# Patient Record
Sex: Female | Born: 1940 | Race: White | Hispanic: No | Marital: Married | Smoking: Never smoker
Health system: Southern US, Community
[De-identification: ages and names within clinical notes are randomized; demographics above are authoritative.]

## PROBLEM LIST (undated history)

## (undated) DIAGNOSIS — E785 Hyperlipidemia, unspecified: Secondary | ICD-10-CM

## (undated) DIAGNOSIS — C719 Malignant neoplasm of brain, unspecified: Secondary | ICD-10-CM

## (undated) DIAGNOSIS — N951 Menopausal and female climacteric states: Secondary | ICD-10-CM

## (undated) DIAGNOSIS — M3500 Sicca syndrome, unspecified: Secondary | ICD-10-CM

## (undated) DIAGNOSIS — I10 Essential (primary) hypertension: Secondary | ICD-10-CM

## (undated) DIAGNOSIS — C50919 Malignant neoplasm of unspecified site of unspecified female breast: Secondary | ICD-10-CM

## (undated) DIAGNOSIS — E039 Hypothyroidism, unspecified: Secondary | ICD-10-CM

## (undated) DIAGNOSIS — C8590 Non-Hodgkin lymphoma, unspecified, unspecified site: Secondary | ICD-10-CM

## (undated) HISTORY — DX: Non-Hodgkin lymphoma, unspecified, unspecified site: C85.90

## (undated) HISTORY — DX: Menopausal and female climacteric states: N95.1

## (undated) HISTORY — DX: Hyperlipidemia, unspecified: E78.5

## (undated) HISTORY — DX: Sjogren syndrome, unspecified: M35.00

## (undated) HISTORY — DX: Malignant neoplasm of brain, unspecified: C71.9

## (undated) HISTORY — DX: Malignant neoplasm of unspecified site of unspecified female breast: C50.919

## (undated) HISTORY — DX: Essential (primary) hypertension: I10

## (undated) HISTORY — DX: Hypothyroidism, unspecified: E03.9

## (undated) HISTORY — PX: BREAST SURGERY: SHX581

## (undated) HISTORY — PX: TUBAL LIGATION: SHX77

---

## 1973-07-04 HISTORY — PX: TONSILLECTOMY: SHX5217

## 1997-12-29 ENCOUNTER — Ambulatory Visit (HOSPITAL_COMMUNITY): Admission: RE | Admit: 1997-12-29 | Discharge: 1997-12-29 | Payer: Self-pay | Admitting: *Deleted

## 1998-01-20 ENCOUNTER — Other Ambulatory Visit: Admission: RE | Admit: 1998-01-20 | Discharge: 1998-01-20 | Payer: Self-pay | Admitting: Family Medicine

## 1998-12-31 ENCOUNTER — Encounter: Payer: Self-pay | Admitting: *Deleted

## 1998-12-31 ENCOUNTER — Ambulatory Visit (HOSPITAL_COMMUNITY): Admission: RE | Admit: 1998-12-31 | Discharge: 1998-12-31 | Payer: Self-pay | Admitting: *Deleted

## 2000-01-03 ENCOUNTER — Encounter: Payer: Self-pay | Admitting: *Deleted

## 2000-01-03 ENCOUNTER — Ambulatory Visit (HOSPITAL_COMMUNITY): Admission: RE | Admit: 2000-01-03 | Discharge: 2000-01-03 | Payer: Self-pay | Admitting: *Deleted

## 2000-02-23 ENCOUNTER — Other Ambulatory Visit: Admission: RE | Admit: 2000-02-23 | Discharge: 2000-02-23 | Payer: Self-pay | Admitting: Family Medicine

## 2001-01-03 ENCOUNTER — Ambulatory Visit (HOSPITAL_COMMUNITY): Admission: RE | Admit: 2001-01-03 | Discharge: 2001-01-03 | Payer: Self-pay | Admitting: *Deleted

## 2001-01-03 ENCOUNTER — Encounter: Payer: Self-pay | Admitting: *Deleted

## 2002-01-18 ENCOUNTER — Encounter: Payer: Self-pay | Admitting: *Deleted

## 2002-01-18 ENCOUNTER — Ambulatory Visit (HOSPITAL_COMMUNITY): Admission: RE | Admit: 2002-01-18 | Discharge: 2002-01-18 | Payer: Self-pay | Admitting: *Deleted

## 2002-08-16 ENCOUNTER — Other Ambulatory Visit: Admission: RE | Admit: 2002-08-16 | Discharge: 2002-08-16 | Payer: Self-pay | Admitting: Family Medicine

## 2003-01-21 ENCOUNTER — Encounter: Payer: Self-pay | Admitting: *Deleted

## 2003-01-21 ENCOUNTER — Ambulatory Visit (HOSPITAL_COMMUNITY): Admission: RE | Admit: 2003-01-21 | Discharge: 2003-01-21 | Payer: Self-pay | Admitting: *Deleted

## 2003-08-27 ENCOUNTER — Other Ambulatory Visit: Admission: RE | Admit: 2003-08-27 | Discharge: 2003-08-27 | Payer: Self-pay | Admitting: Family Medicine

## 2004-02-13 ENCOUNTER — Ambulatory Visit (HOSPITAL_COMMUNITY): Admission: RE | Admit: 2004-02-13 | Discharge: 2004-02-13 | Payer: Self-pay | Admitting: Family Medicine

## 2004-06-24 ENCOUNTER — Ambulatory Visit (HOSPITAL_COMMUNITY): Admission: RE | Admit: 2004-06-24 | Discharge: 2004-06-24 | Payer: Self-pay | Admitting: Family Medicine

## 2004-07-04 HISTORY — PX: CHOLECYSTECTOMY, LAPAROSCOPIC: SHX56

## 2004-08-26 ENCOUNTER — Ambulatory Visit (HOSPITAL_COMMUNITY): Admission: RE | Admit: 2004-08-26 | Discharge: 2004-08-26 | Payer: Self-pay | Admitting: General Surgery

## 2004-08-26 ENCOUNTER — Encounter (INDEPENDENT_AMBULATORY_CARE_PROVIDER_SITE_OTHER): Payer: Self-pay | Admitting: Specialist

## 2004-09-29 ENCOUNTER — Other Ambulatory Visit: Admission: RE | Admit: 2004-09-29 | Discharge: 2004-09-29 | Payer: Self-pay | Admitting: Family Medicine

## 2005-02-17 ENCOUNTER — Ambulatory Visit (HOSPITAL_COMMUNITY): Admission: RE | Admit: 2005-02-17 | Discharge: 2005-02-17 | Payer: Self-pay | Admitting: Family Medicine

## 2006-02-23 ENCOUNTER — Ambulatory Visit (HOSPITAL_COMMUNITY): Admission: RE | Admit: 2006-02-23 | Discharge: 2006-02-23 | Payer: Self-pay | Admitting: Family Medicine

## 2006-04-18 ENCOUNTER — Encounter: Admission: RE | Admit: 2006-04-18 | Discharge: 2006-04-18 | Payer: Self-pay | Admitting: Surgery

## 2006-04-18 ENCOUNTER — Other Ambulatory Visit: Admission: RE | Admit: 2006-04-18 | Discharge: 2006-04-18 | Payer: Self-pay | Admitting: Diagnostic Radiology

## 2006-04-18 ENCOUNTER — Encounter (INDEPENDENT_AMBULATORY_CARE_PROVIDER_SITE_OTHER): Payer: Self-pay | Admitting: *Deleted

## 2006-11-22 ENCOUNTER — Other Ambulatory Visit: Admission: RE | Admit: 2006-11-22 | Discharge: 2006-11-22 | Payer: Self-pay | Admitting: Family Medicine

## 2007-03-01 ENCOUNTER — Ambulatory Visit (HOSPITAL_COMMUNITY): Admission: RE | Admit: 2007-03-01 | Discharge: 2007-03-01 | Payer: Self-pay | Admitting: Family Medicine

## 2007-11-16 ENCOUNTER — Encounter: Payer: Self-pay | Admitting: Infectious Diseases

## 2007-12-10 ENCOUNTER — Encounter: Payer: Self-pay | Admitting: Infectious Diseases

## 2007-12-11 ENCOUNTER — Ambulatory Visit (HOSPITAL_COMMUNITY): Admission: RE | Admit: 2007-12-11 | Discharge: 2007-12-11 | Payer: Self-pay | Admitting: Infectious Diseases

## 2007-12-11 ENCOUNTER — Ambulatory Visit: Payer: Self-pay | Admitting: Infectious Diseases

## 2007-12-11 DIAGNOSIS — R509 Fever, unspecified: Secondary | ICD-10-CM

## 2007-12-11 LAB — CONVERTED CEMR LAB
BUN: 11 mg/dL (ref 6–23)
CO2: 25 meq/L (ref 19–32)
Creatinine, Ser: 0.85 mg/dL (ref 0.40–1.20)
Eosinophils Absolute: 0.1 10*3/uL (ref 0.0–0.7)
Eosinophils Relative: 3 % (ref 0–5)
Glucose, Bld: 102 mg/dL — ABNORMAL HIGH (ref 70–99)
HCT: 47.6 % — ABNORMAL HIGH (ref 36.0–46.0)
Hepatitis B Surface Ag: NEGATIVE
Lymphs Abs: 0.9 10*3/uL (ref 0.7–4.0)
MCV: 87.2 fL (ref 78.0–100.0)
Monocytes Relative: 26 % — ABNORMAL HIGH (ref 3–12)
Platelets: 271 10*3/uL (ref 150–400)
Sed Rate: 12 mm/hr (ref 0–22)
TSH: 1.283 microintl units/mL (ref 0.350–5.50)
Total Bilirubin: 0.6 mg/dL (ref 0.3–1.2)
WBC: 3.2 10*3/uL — ABNORMAL LOW (ref 4.0–10.5)

## 2007-12-13 ENCOUNTER — Encounter: Payer: Self-pay | Admitting: Infectious Diseases

## 2007-12-13 ENCOUNTER — Ambulatory Visit (HOSPITAL_COMMUNITY): Admission: RE | Admit: 2007-12-13 | Discharge: 2007-12-13 | Payer: Self-pay | Admitting: Infectious Diseases

## 2007-12-18 ENCOUNTER — Ambulatory Visit: Payer: Self-pay | Admitting: Infectious Diseases

## 2007-12-20 ENCOUNTER — Ambulatory Visit (HOSPITAL_COMMUNITY): Admission: RE | Admit: 2007-12-20 | Discharge: 2007-12-20 | Payer: Self-pay | Admitting: Infectious Diseases

## 2007-12-24 ENCOUNTER — Telehealth: Payer: Self-pay | Admitting: Infectious Diseases

## 2008-01-02 ENCOUNTER — Encounter: Payer: Self-pay | Admitting: Infectious Diseases

## 2008-01-02 ENCOUNTER — Ambulatory Visit (HOSPITAL_COMMUNITY): Admission: RE | Admit: 2008-01-02 | Discharge: 2008-01-02 | Payer: Self-pay | Admitting: Infectious Diseases

## 2008-01-10 ENCOUNTER — Telehealth (INDEPENDENT_AMBULATORY_CARE_PROVIDER_SITE_OTHER): Payer: Self-pay | Admitting: Licensed Clinical Social Worker

## 2008-01-30 ENCOUNTER — Encounter: Admission: RE | Admit: 2008-01-30 | Discharge: 2008-01-30 | Payer: Self-pay | Admitting: Infectious Diseases

## 2008-01-30 ENCOUNTER — Ambulatory Visit (HOSPITAL_COMMUNITY): Admission: RE | Admit: 2008-01-30 | Discharge: 2008-01-30 | Payer: Self-pay | Admitting: Infectious Diseases

## 2008-02-12 ENCOUNTER — Ambulatory Visit: Payer: Self-pay | Admitting: Infectious Diseases

## 2008-02-12 DIAGNOSIS — M310 Hypersensitivity angiitis: Secondary | ICD-10-CM | POA: Insufficient documentation

## 2008-02-13 ENCOUNTER — Telehealth: Payer: Self-pay | Admitting: Infectious Diseases

## 2008-02-27 ENCOUNTER — Encounter (INDEPENDENT_AMBULATORY_CARE_PROVIDER_SITE_OTHER): Payer: Self-pay | Admitting: Interventional Radiology

## 2008-02-27 ENCOUNTER — Ambulatory Visit (HOSPITAL_COMMUNITY): Admission: RE | Admit: 2008-02-27 | Discharge: 2008-02-27 | Payer: Self-pay | Admitting: Infectious Diseases

## 2008-03-03 ENCOUNTER — Telehealth: Payer: Self-pay | Admitting: Infectious Diseases

## 2008-03-11 ENCOUNTER — Encounter: Payer: Self-pay | Admitting: Infectious Diseases

## 2008-03-11 ENCOUNTER — Telehealth: Payer: Self-pay | Admitting: Infectious Diseases

## 2008-06-11 LAB — HM DEXA SCAN

## 2009-02-13 ENCOUNTER — Encounter: Admission: RE | Admit: 2009-02-13 | Discharge: 2009-02-13 | Payer: Self-pay | Admitting: Obstetrics and Gynecology

## 2010-03-05 ENCOUNTER — Encounter: Admission: RE | Admit: 2010-03-05 | Discharge: 2010-03-05 | Payer: Self-pay | Admitting: Obstetrics and Gynecology

## 2010-03-10 LAB — HM MAMMOGRAPHY

## 2010-08-01 LAB — CONVERTED CEMR LAB
Albumin ELP: 60.3 % (ref 55.8–66.1)
Albumin, U: DETECTED %
Alpha 1, Urine: DETECTED % — AB
Alpha 2, Urine: DETECTED % — AB
Basophils Absolute: 0 10*3/uL (ref 0.0–0.1)
Beta Globulin: 7.6 % — ABNORMAL HIGH (ref 4.7–7.2)
CO2: 28 meq/L (ref 19–32)
Eosinophils Absolute: 0 10*3/uL (ref 0.0–0.7)
Free Kappa Lt Chains,Ur: 1 mg/dL (ref 0.04–1.51)
Glucose, Bld: 98 mg/dL (ref 70–99)
IgG (Immunoglobin G), Serum: 852 mg/dL (ref 694–1618)
IgM, Serum: 122 mg/dL (ref 60–263)
Lymphocytes Relative: 29 % (ref 12–46)
Lymphs Abs: 0.8 10*3/uL (ref 0.7–4.0)
Neutrophils Relative %: 45 % (ref 43–77)
Platelets: 198 10*3/uL (ref 150–400)
Potassium: 4.5 meq/L (ref 3.5–5.3)
Sodium: 142 meq/L (ref 135–145)
Total Protein, Serum Electrophoresis: 6.6 g/dL (ref 6.0–8.3)
WBC: 2.9 10*3/uL — ABNORMAL LOW (ref 4.0–10.5)

## 2010-10-05 ENCOUNTER — Encounter: Payer: Self-pay | Admitting: Family Medicine

## 2010-10-05 DIAGNOSIS — E785 Hyperlipidemia, unspecified: Secondary | ICD-10-CM

## 2010-10-05 DIAGNOSIS — Z780 Asymptomatic menopausal state: Secondary | ICD-10-CM

## 2010-10-05 DIAGNOSIS — C50919 Malignant neoplasm of unspecified site of unspecified female breast: Secondary | ICD-10-CM

## 2010-10-05 DIAGNOSIS — E039 Hypothyroidism, unspecified: Secondary | ICD-10-CM

## 2010-10-05 DIAGNOSIS — M3500 Sicca syndrome, unspecified: Secondary | ICD-10-CM

## 2010-10-06 ENCOUNTER — Encounter: Payer: Self-pay | Admitting: Family Medicine

## 2010-10-06 DIAGNOSIS — M810 Age-related osteoporosis without current pathological fracture: Secondary | ICD-10-CM | POA: Insufficient documentation

## 2010-10-06 DIAGNOSIS — C50919 Malignant neoplasm of unspecified site of unspecified female breast: Secondary | ICD-10-CM | POA: Insufficient documentation

## 2010-10-06 DIAGNOSIS — M3500 Sicca syndrome, unspecified: Secondary | ICD-10-CM | POA: Insufficient documentation

## 2010-10-06 DIAGNOSIS — E039 Hypothyroidism, unspecified: Secondary | ICD-10-CM | POA: Insufficient documentation

## 2010-10-06 DIAGNOSIS — E785 Hyperlipidemia, unspecified: Secondary | ICD-10-CM | POA: Insufficient documentation

## 2010-10-06 DIAGNOSIS — Z780 Asymptomatic menopausal state: Secondary | ICD-10-CM | POA: Insufficient documentation

## 2010-11-19 NOTE — Op Note (Signed)
NAMEJALEN, Wendy Kelley                ACCOUNT NO.:  [id number]   MEDICAL RECORD NO.:  [id number]          PATIENT TYPE:  AMB   LOCATION:  DAY                          FACILITY:  [facility]   PHYSICIAN:  Leonie Man, M.D.   DATE OF BIRTH:  1940/09/22   DATE OF PROCEDURE:  08/26/2004  DATE OF DISCHARGE:                                 OPERATIVE REPORT   PREOPERATIVE DIAGNOSIS:  Chronic calculous cholecystitis.   POSTOPERATIVE DIAGNOSES:  Chronic calculous cholecystitis.   PROCEDURES:  Laparoscopic cholecystectomy with intraoperative cholangiogram.   SURGEON:  Leonie Man, M.D.   ASSISTANT:  Rose Phi. Maple Hudson, M.D.   ANESTHESIA:  General.   Ms. Dault is a 70 year old lady with a diagnosis of  __________ syndrome  who presents with epigastric pain. This problem complicated by the fact that  she does have some reflux.. Every morning, she has a spell of vomiting. She  also has some diarrhea in the morning.  She had a CT scan of the abdomen  which showed cholelithiasis. The patient comes to the operating room now  after the risks and potential benefits of surgery have been discussed and  all questions answered, consent obtained. Liver function studies are all  within normal limits.   PROCEDURE:  Following the induction of satisfactory general anesthesia, the  patient is positioned supinely and the abdomen is routinely prepped and  draped to be included in the sterile operative field. Open laparoscopy was  created at the umbilicus with insertion of a Hassan type cannula,  insufflation of the peritoneal cavity carried out with carbon dioxide to 14  mmHg pressure. The camera was inserted and visual exploration of the abdomen  carried out. The gallbladder was chronically scarred with some adhesions to  the wall of the gallbladder. The liver edges were sharp. Liver surfaces  smooth. None of the small or large intestine viewed appeared to be abnormal.  Under direct vision, epigastric and  lateral ports are placed. The  gallbladder was grasped and retracted cephalad. The dissection of adhesions  to the gallbladder wall are taken down and the dissection carried down to  the ampulla. The cystic duct and cystic artery were then identified tracing  the cystic duct up to its gallbladder junction and the cystic artery to its  entry into the gallbladder wall. The cystic duct was clipped proximally and  opened, the cystic artery was doubly clipped and transected. The cystic duct  catheter was passed into the abdomen then inserted into the cystic duct. I  injected 1/2 strength Hypaque into the cystic duct under fluoroscopic  control. The resulting cholangiogram showed prompt flow of contrast through  a normal caliber common bile duct and common hepatic duct with prompt entry  into the duodenum. The upper hepatic radicles appeared to be entirely  normal. The Cystic duct catheter was removed and the cystic duct was triply  clipped and transected. The gallbladder was then dissected free from the  liver bed using electrocautery and maintaining hemostasis throughout the  course of the dissection. At the end of dissection, the right upper quadrant  was then  thoroughly irrigated with normal saline and aspirated. The  gallbladder was retrieved through the umbilical port without difficulty. The  pneumoperitoneum was allowed to deflate after the lateral trocars were  removed under direct vision. All trocars were then removed. The wounds were  closed in layers  as follows:  The umbilical wound in two layers with #0Vicryl and 4-0  Monocryl, epigastric wound closed with 4-0 Monocryl, lateral flank wounds,  epigastric and umbilical wounds then reinforced with Dermabond.  The patient  was then removed from the operating room to the recovery room in stable  condition.  She tolerated the procedure well.      PB/MEDQ  D:  08/26/2004  T:  08/26/2004  Job:  [id number]   cc:   Charlesetta Shanks  [address]  [place]  [place] [address]  Fax: [phone]

## 2011-01-25 ENCOUNTER — Ambulatory Visit (INDEPENDENT_AMBULATORY_CARE_PROVIDER_SITE_OTHER): Payer: Medicare Other | Admitting: Cardiology

## 2011-01-25 ENCOUNTER — Encounter: Payer: Self-pay | Admitting: Cardiology

## 2011-01-25 VITALS — BP 142/76 | HR 112 | Ht 63.0 in | Wt 131.0 lb

## 2011-01-25 DIAGNOSIS — R000 Tachycardia, unspecified: Secondary | ICD-10-CM

## 2011-01-25 DIAGNOSIS — R9431 Abnormal electrocardiogram [ECG] [EKG]: Secondary | ICD-10-CM

## 2011-01-25 NOTE — Assessment & Plan Note (Signed)
Electrocardiogram shows cannot rule out prior septal infarct versus lead placement. Will schedule echocardiogram to assess LV function and wall motion. Will also exclude pericardial effusion given mild tachycardia. If negative no plans for further cardiac evaluation.

## 2011-01-25 NOTE — Progress Notes (Signed)
HPI: 70 year old female with no prior cardiac history for evaluation of abnormal electrocardiogram. Echocardiogram in June of 2009 showed an ejection fraction of 65-70%. Labs 01/07/11 show normal TSH, normal K. Patient with recent URI but otherwise denies dyspnea on exertion, orthopnea and, PND, pedal edema, syncope or chest pain. She states she has had an elevated heart rate for years. She does not have significant palpitations.  Current Outpatient Prescriptions  Medication Sig Dispense Refill  . calcium-vitamin D (OSCAL WITH D) 500-200 MG-UNIT per tablet Take 1 tablet by mouth daily.        . Cholecalciferol (VITAMIN D3) 1000 UNITS CAPS Take 1 capsule by mouth daily.        . fish oil-omega-3 fatty acids 1000 MG capsule Take 2 g by mouth daily.        Marland Kitchen levothyroxine (SYNTHROID, LEVOTHROID) 50 MCG tablet Take 50 mcg by mouth daily.        Marland Kitchen lisinopril (PRINIVIL,ZESTRIL) 10 MG tablet Take 10 mg by mouth daily.        . predniSONE (DELTASONE) 10 MG tablet Take one pill bid prn      . simvastatin (ZOCOR) 20 MG tablet Take 20 mg by mouth at bedtime.         No Known Allergies  Past Medical History  Diagnosis Date  . Sjogrens syndrome   . Breast cancer     H/O chemotherapy  . Symptomatic menopausal or female climacteric states   . Osteoporosis   . Hypothyroidism   . Hyperlipidemia   . Hypertension     Past Surgical History  Procedure Date  . Breast surgery   . Tonsillectomy 1975  . Cholecystectomy, laparoscopic 2006  . Tubal ligation     History   Social History  . Marital Status: Married    Spouse Name: N/A    Number of Children: 4  . Years of Education: N/A   Occupational History  . Not on file.   Social History Main Topics  . Smoking status: Never Smoker   . Smokeless tobacco: Not on file  . Alcohol Use: No  . Drug Use: Not on file  . Sexually Active: Not on file   Other Topics Concern  . Not on file   Social History Narrative  . No narrative on file     Family History  Problem Relation Age of Onset  . Hypertension Mother   . Diabetes type II Mother   . Heart attack Mother     Died at age 62 of MI  . Heart disease Mother   . Diabetes Mother   . Hypothyroidism Sister     ROS: Pain Left Knee and dry mouth but no fevers or chills, productive cough, hemoptysis, dysphasia, odynophagia, melena, hematochezia, dysuria, hematuria, rash, seizure activity, orthopnea, PND, pedal edema, claudication. Remaining systems are negative.  Physical Exam: General:  Well developed/well nourished in NAD Skin warm/dry; ecchymosis over lower extremities Patient not depressed No peripheral clubbing Back-normal HEENT-normal/normal eyelids Neck supple/normal carotid upstroke bilaterally; no bruits; no JVD; no thyromegaly chest - CTA/ normal expansion; S/P right mastectomy CV - tachycardic but regular rhythm/normal S1 and S2; no murmurs, rubs or gallops;  PMI nondisplaced Abdomen -NT/ND, no HSM, no mass, + bowel sounds, no bruit 2+ femoral pulses, no bruits Ext-no edema, chords, 2+ DP Neuro-grossly nonfocal  ECG 01/07/11 - sinus tachycardia at a rate of 105. Axis normal. Cannot rule out prior septal infarct Sinus rhythm at a rate of 112. Cannot rule out  prior septal infarct.

## 2011-01-25 NOTE — Patient Instructions (Signed)
Your physician has requested that you have an echocardiogram. Echocardiography is a painless test that uses sound waves to create images of your heart. It provides your doctor with information about the size and shape of your heart and how well your heart's chambers and valves are working. This procedure takes approximately one hour. There are no restrictions for this procedure.  Your physician recommends that you return for lab work in: today  .  

## 2011-01-25 NOTE — Assessment & Plan Note (Signed)
Patient states her heart rate has been elevated for years. Mildly elevated in the office today. Recent TSH normal. Check hemoglobin. Echocardiogram to quantify LV function.

## 2011-01-26 ENCOUNTER — Other Ambulatory Visit: Payer: Medicare Other

## 2011-01-26 DIAGNOSIS — R000 Tachycardia, unspecified: Secondary | ICD-10-CM

## 2011-01-27 LAB — CBC WITH DIFFERENTIAL/PLATELET
Lymphocytes Relative: 19 % (ref 12–46)
Lymphs Abs: 0.7 10*3/uL (ref 0.7–4.0)
MCV: 93 fL (ref 78.0–100.0)
Neutro Abs: 2.2 10*3/uL (ref 1.7–7.7)
Neutrophils Relative %: 66 % (ref 43–77)
Platelets: 180 10*3/uL (ref 150–400)
RBC: 4.87 MIL/uL (ref 3.87–5.11)
WBC: 3.4 10*3/uL — ABNORMAL LOW (ref 4.0–10.5)

## 2011-01-28 ENCOUNTER — Ambulatory Visit (HOSPITAL_COMMUNITY): Payer: Medicare Other | Attending: Infectious Diseases | Admitting: Radiology

## 2011-01-28 DIAGNOSIS — C50919 Malignant neoplasm of unspecified site of unspecified female breast: Secondary | ICD-10-CM | POA: Insufficient documentation

## 2011-01-28 DIAGNOSIS — I10 Essential (primary) hypertension: Secondary | ICD-10-CM | POA: Insufficient documentation

## 2011-01-28 DIAGNOSIS — E785 Hyperlipidemia, unspecified: Secondary | ICD-10-CM | POA: Insufficient documentation

## 2011-01-28 DIAGNOSIS — R000 Tachycardia, unspecified: Secondary | ICD-10-CM

## 2011-01-28 DIAGNOSIS — I079 Rheumatic tricuspid valve disease, unspecified: Secondary | ICD-10-CM | POA: Insufficient documentation

## 2011-01-28 DIAGNOSIS — R9431 Abnormal electrocardiogram [ECG] [EKG]: Secondary | ICD-10-CM

## 2011-02-03 ENCOUNTER — Encounter: Payer: Self-pay | Admitting: Cardiology

## 2011-03-09 ENCOUNTER — Other Ambulatory Visit: Payer: Self-pay | Admitting: Family Medicine

## 2011-03-09 DIAGNOSIS — Z1231 Encounter for screening mammogram for malignant neoplasm of breast: Secondary | ICD-10-CM

## 2011-03-22 ENCOUNTER — Ambulatory Visit (HOSPITAL_COMMUNITY): Payer: Medicare Other

## 2011-05-05 ENCOUNTER — Ambulatory Visit (HOSPITAL_COMMUNITY)
Admission: RE | Admit: 2011-05-05 | Discharge: 2011-05-05 | Disposition: A | Discharge status: Home / Self Care | Payer: Medicare Other | Source: Ambulatory Visit | Attending: Family Medicine | Admitting: Family Medicine

## 2011-05-05 DIAGNOSIS — Z1231 Encounter for screening mammogram for malignant neoplasm of breast: Secondary | ICD-10-CM

## 2011-10-21 ENCOUNTER — Ambulatory Visit
Admission: RE | Admit: 2011-10-21 | Discharge: 2011-10-21 | Disposition: A | Discharge status: Home / Self Care | Payer: Medicare Other | Source: Ambulatory Visit | Attending: Family Medicine | Admitting: Family Medicine

## 2011-10-21 ENCOUNTER — Other Ambulatory Visit: Payer: Self-pay | Admitting: Family Medicine

## 2011-10-21 DIAGNOSIS — R410 Disorientation, unspecified: Secondary | ICD-10-CM

## 2013-06-24 ENCOUNTER — Encounter (INDEPENDENT_AMBULATORY_CARE_PROVIDER_SITE_OTHER): Payer: Self-pay

## 2013-06-24 ENCOUNTER — Ambulatory Visit (INDEPENDENT_AMBULATORY_CARE_PROVIDER_SITE_OTHER): Payer: Medicare Other | Admitting: Family Medicine

## 2013-06-24 ENCOUNTER — Encounter: Payer: Self-pay | Admitting: Family Medicine

## 2013-06-24 VITALS — BP 104/72 | HR 118 | Temp 99.6°F | Ht 63.0 in | Wt 116.0 lb

## 2013-06-24 DIAGNOSIS — C50919 Malignant neoplasm of unspecified site of unspecified female breast: Secondary | ICD-10-CM

## 2013-06-24 DIAGNOSIS — E785 Hyperlipidemia, unspecified: Secondary | ICD-10-CM

## 2013-06-24 DIAGNOSIS — E039 Hypothyroidism, unspecified: Secondary | ICD-10-CM

## 2013-06-24 DIAGNOSIS — R32 Unspecified urinary incontinence: Secondary | ICD-10-CM | POA: Insufficient documentation

## 2013-06-24 DIAGNOSIS — C8590 Non-Hodgkin lymphoma, unspecified, unspecified site: Secondary | ICD-10-CM | POA: Insufficient documentation

## 2013-06-24 DIAGNOSIS — M3500 Sicca syndrome, unspecified: Secondary | ICD-10-CM

## 2013-06-24 DIAGNOSIS — R42 Dizziness and giddiness: Secondary | ICD-10-CM | POA: Insufficient documentation

## 2013-06-24 DIAGNOSIS — C8589 Other specified types of non-Hodgkin lymphoma, extranodal and solid organ sites: Secondary | ICD-10-CM

## 2013-06-24 LAB — POCT URINALYSIS DIPSTICK
Bilirubin, UA: NEGATIVE
Glucose, UA: NEGATIVE
Ketones, UA: NEGATIVE
Nitrite, UA: POSITIVE
Spec Grav, UA: >=1.03
Urobilinogen, UA: 0.2
pH, UA: 6.5

## 2013-06-24 LAB — POCT UA - MICROSCOPIC ONLY
Casts, Ur, LPF, POC: NEGATIVE
Crystals, Ur, HPF, POC: NEGATIVE
Mucus, UA: NEGATIVE
Yeast, UA: NEGATIVE

## 2013-06-24 NOTE — Progress Notes (Signed)
Patient ID: Wendy Kelley, female   DOB: 1941-05-28, 72 y.o.   MRN: [id number] SUBJECTIVE: CC: Chief Complaint  Patient presents with  . Fall    2 falls yesterday. No LOC and no head injury.   . Urinary Incontinence    HPI:  Came because of frequency of urination, and weakness, and falling for the last 2 days. Patient known to have lymphoma at [facility] for care.  Past Medical History  Diagnosis Date  . Sjogrens syndrome   . Symptomatic menopausal or female climacteric states   . Osteoporosis   . Hypothyroidism   . Hyperlipidemia   . Hypertension   . Breast cancer     H/O chemotherapy  . Brain cancer   . Lymphoma    Past Surgical History  Procedure Laterality Date  . Breast surgery    . Tonsillectomy  1975  . Cholecystectomy, laparoscopic  2006  . Tubal ligation     History   Social History  . Marital Status: Married    Spouse Name: N/A    Number of Children: 4  . Years of Education: N/A   Occupational History  . Not on file.   Social History Main Topics  . Smoking status: Never Smoker   . Smokeless tobacco: Not on file  . Alcohol Use: No  . Drug Use: Not on file  . Sexual Activity: Not on file   Other Topics Concern  . Not on file   Social History Narrative  . No narrative on file   Family History  Problem Relation Age of Onset  . Hypertension Mother   . Diabetes type II Mother   . Heart attack Mother     Died at age 24 of MI  . Heart disease Mother   . Diabetes Mother   . Hypothyroidism Sister    Current Outpatient Prescriptions on File Prior to Visit  Medication Sig Dispense Refill  . calcium-vitamin D (OSCAL WITH D) 500-200 MG-UNIT per tablet Take 1 tablet by mouth daily.        . Cholecalciferol (VITAMIN D3) 1000 UNITS CAPS Take 1 capsule by mouth daily.        Marland Kitchen levothyroxine (SYNTHROID, LEVOTHROID) 50 MCG tablet Take 50 mcg by mouth daily.        Marland Kitchen lisinopril (PRINIVIL,ZESTRIL) 10 MG tablet Take 10 mg by mouth daily.        . predniSONE  (DELTASONE) 10 MG tablet Take one pill bid prn      . simvastatin (ZOCOR) 20 MG tablet Take 20 mg by mouth at bedtime.       . fish oil-omega-3 fatty acids 1000 MG capsule Take 2 g by mouth daily.         No current facility-administered medications on file prior to visit.   Allergies  Allergen Reactions  . Adhesive [Tape]    Immunization History  Administered Date(s) Administered  . Pneumococcal Polysaccharide-23 11/01/2005  . Td 11/01/2005   Prior to Admission medications   Medication Sig Start Date End Date Taking? Authorizing Provider  calcium-vitamin D (OSCAL WITH D) 500-200 MG-UNIT per tablet Take 1 tablet by mouth daily.     Yes Historical Provider, MD  Cholecalciferol (VITAMIN D3) 1000 UNITS CAPS Take 1 capsule by mouth daily.     Yes Historical Provider, MD  levothyroxine (SYNTHROID, LEVOTHROID) 50 MCG tablet Take 50 mcg by mouth daily.     Yes Historical Provider, MD  lisinopril (PRINIVIL,ZESTRIL) 10 MG tablet Take 10 mg by  mouth daily.     Yes Historical Provider, MD  predniSONE (DELTASONE) 10 MG tablet Take one pill bid prn   Yes Historical Provider, MD  simvastatin (ZOCOR) 20 MG tablet Take 20 mg by mouth at bedtime.    Yes Historical Provider, MD  fish oil-omega-3 fatty acids 1000 MG capsule Take 2 g by mouth daily.      Historical Provider, MD     ROS: As above in the HPI. All other systems are stable or negative.  OBJECTIVE: APPEARANCE:  Patient in no acute distress.The patient appeared well nourished and normally developed. Acyanotic. Waist: VITAL SIGNS:BP 104/72  Pulse 118  Temp(Src) 99.6 F (37.6 C) (Oral)  Ht 5\' 3"  (1.6 m)  Wt 116 lb (52.617 kg)  BMI 20.55 kg/m2  SpO2 97%  Thin WF  SKIN: warm and  Dry without overt rashes, tattoos and scars  HEAD and Neck: without JVD, Head and scalp: normal Eyes:No scleral icterus. Fundi normal, eye movements normal. Ears: Auricle normal, canal normal, Tympanic membranes normal, insufflation normal. Nose:  normal Throat: normal Neck & thyroid: normal  CHEST & LUNGS: Chest wall: normal Lungs: Coughing; rales in the RML and RLL.  CVS: Reveals the PMI to be normally located. Regular rhythm, First and Second Heart sounds are normal,  absence of murmurs, rubs or gallops. Peripheral vasculature: Radial pulses: normal Dorsal pedis pulses: normal Posterior pulses: normal  ABDOMEN:  Appearance: normal Benign, no organomegaly, no masses, no Abdominal Aortic enlargement. No Guarding , no rebound. No Bruits. Bowel sounds: normal  RECTAL: N/A GU: N/A  EXTREMETIES: nonedematous.  MUSCULOSKELETAL:  Spine: normal Joints: intact  NEUROLOGIC: oriented to time,place and person; nonfocal.unstable gait and has a tendency to fall.  ASSESSMENT: Incontinence - Plan: POCT urinalysis dipstick, POCT UA - Microscopic Only, Urine culture  Dizziness - Plan: CANCELED: EKG 12-Lead  Breast cancer, unspecified laterality  Hyperlipidemia  Lymphoma  Hypothyroidism  Sjogrens syndrome Patient needs evaluation in the ED setting  PLAN:  Orders Placed This Encounter  Procedures  . Urine culture  . POCT urinalysis dipstick  . POCT UA - Microscopic Only  patient referred to the ED at [facility] for evaluation. No orders of the defined types were placed in this encounter.   There are no discontinued medications. Return in about 3 months (around 09/22/2013) for Recheck medical problems.  Wendy Kelley, M.D.

## 2013-06-26 LAB — URINE CULTURE

## 2013-07-01 NOTE — Progress Notes (Signed)
Quick Note:  Call Patient Labs that are abnormal: Urine culture  The patient was admitted to [facility].   ______

## 2013-09-10 ENCOUNTER — Ambulatory Visit (INDEPENDENT_AMBULATORY_CARE_PROVIDER_SITE_OTHER): Payer: Medicare Other | Admitting: Nurse Practitioner

## 2013-09-10 VITALS — BP 116/67 | HR 128 | Temp 100.8°F | Ht 63.0 in | Wt 120.0 lb

## 2013-09-10 DIAGNOSIS — H6590 Unspecified nonsuppurative otitis media, unspecified ear: Secondary | ICD-10-CM

## 2013-09-10 DIAGNOSIS — N390 Urinary tract infection, site not specified: Secondary | ICD-10-CM

## 2013-09-10 DIAGNOSIS — B370 Candidal stomatitis: Secondary | ICD-10-CM

## 2013-09-10 MED ORDER — NYSTATIN 100000 UNIT/ML MT SUSP: 5.0000 mL | Freq: Four times a day (QID) | OROMUCOSAL | Status: DC

## 2013-09-10 MED ORDER — AZITHROMYCIN 250 MG PO TABS: ORAL_TABLET | ORAL | Status: DC

## 2013-09-10 NOTE — Patient Instructions (Signed)
Otitis Media, Adult Otitis media is redness, soreness, and swelling (inflammation) of the middle ear. Otitis media may be caused by allergies or, most commonly, by infection. Often it occurs as a complication of the common cold. SIGNS AND SYMPTOMS Symptoms of otitis media may include:  Earache.  Fever.  Ringing in your ear.  Headache.  Leakage of fluid from the ear. DIAGNOSIS To diagnose otitis media, your health care provider will examine your ear with an otoscope. This is an instrument that allows your health care provider to see into your ear in order to examine your eardrum. Your health care provider also will ask you questions about your symptoms. TREATMENT  Typically, otitis media resolves on its own within 3 5 days. Your health care provider may prescribe medicine to ease your symptoms of pain. If otitis media does not resolve within 5 days or is recurrent, your health care provider may prescribe antibiotic medicines if he or she suspects that a bacterial infection is the cause. HOME CARE INSTRUCTIONS   Take your medicine as directed until it is gone, even if you feel better after the first few days.  Only take over-the-counter or prescription medicines for pain, discomfort, or fever as directed by your health care provider.  Follow up with your health care provider as directed. SEEK MEDICAL CARE IF:  You have otitis media only in one ear or bleeding from your nose or both.  You notice a lump on your neck.  You are not getting better in 3 5 days.  You feel worse instead of better. SEEK IMMEDIATE MEDICAL CARE IF:   You have pain that is not controlled with medicine.  You have swelling, redness, or pain around your ear or stiffness in your neck.  You notice that part of your face is paralyzed.  You notice that the bone behind your ear (mastoid) is tender when you touch it. MAKE SURE YOU:   Understand these instructions.  Will watch your condition.  Will get help  right away if you are not doing well or get worse. Document Released: 03/25/2004 Document Revised: 04/10/2013 Document Reviewed: 01/15/2013 [employer] Patient Information 2014 [employer].  

## 2013-09-10 NOTE — Progress Notes (Signed)
Subjective:    Patient ID: Wendy Kelley, female    DOB: 1941/05/26, 73 y.o.   MRN: [id number]  HPI Patient presents today complaining of fever and cough x 1 week. Associated symptoms wheezing, SOB with exertion, congestion, sinus pressure, and rhinorrhea. No OTC medications tried.    Review of Systems  Constitutional: Positive for fever. Negative for chills.  HENT: Positive for congestion, rhinorrhea and sinus pressure.   Respiratory: Positive for shortness of breath (with exertion only) and wheezing.   Cardiovascular: Negative for chest pain.  All other systems reviewed and are negative.       Objective:   Physical Exam  Constitutional: She is oriented to person, place, and time. She appears well-developed and well-nourished.  HENT:  Head: Normocephalic.  Right Ear: Tympanic membrane is erythematous. A middle ear effusion is present.  Left Ear: Tympanic membrane is erythematous. A middle ear effusion is present.  Nose: Right sinus exhibits no maxillary sinus tenderness and no frontal sinus tenderness. Left sinus exhibits no maxillary sinus tenderness and no frontal sinus tenderness.  Cardiovascular: Normal rate and regular rhythm.   Pulmonary/Chest: Effort normal and breath sounds normal.  Neurological: She is alert and oriented to person, place, and time.  Skin: Skin is warm and dry.  Psychiatric: She has a normal mood and affect. Her behavior is normal. Judgment and thought content normal.   BP 116/67  Pulse 128  Temp(Src) 100.8 F (38.2 C) (Oral)  Ht 5\' 3"  (1.6 m)  Wt 120 lb (54.432 kg)  BMI 21.26 kg/m2  SpO2 95%        Assessment & Plan:   1. Oral thrush   2. Otitis media with effusion   3. Infection of urinary tract    Meds ordered this encounter  Medications  . pilocarpine (SALAGEN) 5 MG tablet    Sig: 5 mg.  . mirtazapine (REMERON) 15 MG tablet    Sig: 30 mg.  . hydroxychloroquine (PLAQUENIL) 200 MG tablet    Sig: 200 mg.  . ferrous sulfate 325  (65 FE) MG tablet    Sig: 325 mg.  . fenofibrate micronized (LOFIBRA) 134 MG capsule    Sig: TAKE ONE CAPSULE BY MOUTH ONE TIME DAILY  . diphenhydrAMINE (BENADRYL) 25 mg capsule    Sig: Take two pills one hour prior to CT scans  . azithromycin (ZITHROMAX) 250 MG tablet    Sig: As directed    Dispense:  6 tablet    Refill:  0    Order Specific Question:  Supervising Provider    Answer:  Chipper Herb [[id number]]  . nystatin (MYCOSTATIN) 100000 UNIT/ML suspension    Sig: Take 5 mLs (500,000 Units total) by mouth 4 (four) times daily.    Dispense:  60 mL    Refill:  0    Order Specific Question:  Supervising Provider    Answer:  Chipper Herb [[id number]]   1. Take meds as prescribed 2. Use a cool mist humidifier especially during the winter months and when heat has been humid. 3. Use saline nose sprays frequently 4. Saline irrigations of the nose can be very helpful if done frequently.  * 4X daily for 1 week*  * Use of a nettie pot can be helpful with this. Follow directions with this* 5. Drink plenty of fluids 6. Keep thermostat turn down low 7.For any cough or congestion  Use plain Mucinex- regular strength or max strength is fine   * Children- consult with  Pharmacist for dosing 8. For fever or aces or pains- take tylenol or ibuprofen appropriate for age and weight.  * for fevers greater than 101 orally you may alternate ibuprofen and tylenol every  3 hours.   Mary-Margaret Hassell Done, FNP

## 2014-04-22 ENCOUNTER — Ambulatory Visit (INDEPENDENT_AMBULATORY_CARE_PROVIDER_SITE_OTHER): Payer: Medicare Other | Admitting: Family Medicine

## 2014-04-22 VITALS — BP 89/64 | HR 88 | Temp 97.5°F | Ht 63.0 in | Wt 124.5 lb

## 2014-04-22 DIAGNOSIS — L989 Disorder of the skin and subcutaneous tissue, unspecified: Secondary | ICD-10-CM

## 2014-04-22 NOTE — Progress Notes (Signed)
   Subjective:    Patient ID: Wendy Kelley, female    DOB: [birth year], 73 y.o.   MRN: [id number]  HPI C/o facial lesion on left face.   Review of Systems C/o skin lesion left face No chest pain, SOB, HA, dizziness, vision change, N/V, diarrhea, constipation, dysuria, urinary urgency or frequency, myalgias, arthralgias or rash.     Objective:   Physical Exam  Vital signs noted  Well developed well nourished female.  HEENT - Head atraumatic Normocephalic Respiratory - Lungs CTA bilateral Cardiac - RRR S1 and S2 without murmur Skin - Erythematous skin lesion left cheek proximal nasolabial fold      Assessment & Plan:  Facial skin lesion - Plan: Ambulatory referral to Dermatology  Lysbeth Penner FNP

## 2014-05-19 ENCOUNTER — Ambulatory Visit (INDEPENDENT_AMBULATORY_CARE_PROVIDER_SITE_OTHER): Payer: Medicare Other | Admitting: Family Medicine

## 2014-05-19 ENCOUNTER — Ambulatory Visit (INDEPENDENT_AMBULATORY_CARE_PROVIDER_SITE_OTHER): Payer: Medicare Other

## 2014-05-19 ENCOUNTER — Telehealth: Payer: Self-pay | Admitting: Family Medicine

## 2014-05-19 VITALS — BP 101/68 | HR 109 | Temp 96.6°F | Wt 117.0 lb

## 2014-05-19 DIAGNOSIS — R509 Fever, unspecified: Secondary | ICD-10-CM

## 2014-05-19 DIAGNOSIS — R059 Cough, unspecified: Secondary | ICD-10-CM

## 2014-05-19 DIAGNOSIS — J206 Acute bronchitis due to rhinovirus: Secondary | ICD-10-CM

## 2014-05-19 DIAGNOSIS — R05 Cough: Secondary | ICD-10-CM

## 2014-05-19 DIAGNOSIS — N390 Urinary tract infection, site not specified: Secondary | ICD-10-CM

## 2014-05-19 DIAGNOSIS — R319 Hematuria, unspecified: Secondary | ICD-10-CM

## 2014-05-19 LAB — POCT UA - MICROSCOPIC ONLY
Casts, Ur, LPF, POC: NEGATIVE
Crystals, Ur, HPF, POC: NEGATIVE
Mucus, UA: NEGATIVE

## 2014-05-19 LAB — POCT CBC
Granulocyte percent: 64.8 %G (ref 37–80)
HCT, POC: 41 % (ref 37.7–47.9)
Hemoglobin: 13.6 g/dL (ref 12.2–16.2)
Lymph, poc: 1.1 (ref 0.6–3.4)
MCH, POC: 30.2 pg (ref 27–31.2)
MCHC: 33.1 g/dL (ref 31.8–35.4)
MCV: 91.1 fL (ref 80–97)
MPV: 8.3 fL (ref 0–99.8)
POC Granulocyte: 2.2 (ref 2–6.9)
POC LYMPH PERCENT: 32.2 %L (ref 10–50)
Platelet Count, POC: 200 10*3/uL (ref 142–424)
RBC: 4.5 M/uL (ref 4.04–5.48)
RDW, POC: 15 %
WBC: 3.4 10*3/uL — AB (ref 4.6–10.2)

## 2014-05-19 LAB — POCT URINALYSIS DIPSTICK
Bilirubin, UA: NEGATIVE
Blood, UA: NEGATIVE
Glucose, UA: NEGATIVE
Ketones, UA: NEGATIVE
Nitrite, UA: POSITIVE
Spec Grav, UA: 1.025
Urobilinogen, UA: NEGATIVE
pH, UA: 6

## 2014-05-19 MED ORDER — AZITHROMYCIN 250 MG PO TABS: ORAL_TABLET | ORAL | Status: DC

## 2014-05-19 MED ORDER — BENZONATATE 100 MG PO CAPS: 100.0000 mg | ORAL_CAPSULE | Freq: Three times a day (TID) | ORAL | Status: DC | PRN

## 2014-05-19 MED ORDER — CIPROFLOXACIN HCL 500 MG PO TABS: 500.0000 mg | ORAL_TABLET | Freq: Two times a day (BID) | ORAL | Status: DC

## 2014-05-19 NOTE — Progress Notes (Signed)
Subjective:    Patient ID: Wendy Kelley, female    DOB: 01-Jun-1941, 73 y.o.   MRN: [id number]  HPI Patient c/o fever and cough.  She has felt weak and she feels like her chest is heavy.  Review of Systems  Constitutional: Negative for fever.  HENT: Negative for ear pain.   Eyes: Negative for discharge.  Respiratory: Negative for cough.   Cardiovascular: Negative for chest pain.  Gastrointestinal: Negative for abdominal distention.  Endocrine: Negative for polyuria.  Genitourinary: Negative for difficulty urinating.  Musculoskeletal: Negative for gait problem and neck pain.  Skin: Negative for color change and rash.  Neurological: Negative for speech difficulty and headaches.  Psychiatric/Behavioral: Negative for agitation.       Objective:    BP 101/68 mmHg  Pulse 109  Temp(Src) 96.6 F (35.9 C) (Oral)  Wt 117 lb (53.071 kg) Physical Exam  Constitutional: She is oriented to person, place, and time. She appears well-developed and well-nourished.  HENT:  Head: Normocephalic and atraumatic.  Mouth/Throat: Oropharynx is clear and moist.  Eyes: Pupils are equal, round, and reactive to light.  Neck: Normal range of motion. Neck supple.  Cardiovascular: Normal rate and regular rhythm.   No murmur heard. Pulmonary/Chest: Effort normal and breath sounds normal.  Abdominal: Soft. Bowel sounds are normal. There is no tenderness.  Neurological: She is alert and oriented to person, place, and time.  Skin: Skin is warm and dry.  Psychiatric: She has a normal mood and affect.   Results for orders placed or performed in visit on 05/19/14  POCT CBC  Result Value Ref Range   WBC 3.4 (A) 4.6 - 10.2 K/uL   Lymph, poc 1.1 0.6 - 3.4   POC LYMPH PERCENT 32.2 10 - 50 %L   POC Granulocyte 2.2 2 - 6.9   Granulocyte percent 64.8 37 - 80 %G   RBC 4.5 4.04 - 5.48 M/uL   Hemoglobin 13.6 12.2 - 16.2 g/dL   HCT, POC 41.0 37.7 - 47.9 %   MCV 91.1 80 - 97 fL   MCH, POC 30.2 27 - 31.2 pg   MCHC 33.1 31.8 - 35.4 g/dL   RDW, POC 15.0 %   Platelet Count, POC 200.0 142 - 424 K/uL   MPV 8.3 0 - 99.8 fL  POCT urinalysis dipstick  Result Value Ref Range   Color, UA STRAW    Clarity, UA CLOUDY    Glucose, UA NEG    Bilirubin, UA NEG    Ketones, UA NEG    Spec Grav, UA 1.025    Blood, UA NEG    pH, UA 6.0    Protein, UA 3+    Urobilinogen, UA negative    Nitrite, UA POS    Leukocytes, UA large (3+)   POCT UA - Microscopic Only  Result Value Ref Range   WBC, Ur, HPF, POC 15-20    RBC, urine, microscopic 1-5    Bacteria, U Microscopic MOD    Mucus, UA NEG    Epithelial cells, urine per micros OCC    Crystals, Ur, HPF, POC NEG    Casts, Ur, LPF, POC NEG    Yeast, UA OCC    cxr - no infiltrate Prelimnary reading by Gwyndolyn Saxon Jimma Ortman,FNP      Assessment & Plan:     ICD-9-CM ICD-10-CM   1. Fever and chills 780.60 R50.9 POCT CBC     POCT urinalysis dipstick     POCT UA - Microscopic Only  DG Chest 2 View  2. Cough 786.2 R05 DG Chest 2 View  3. Acute bronchitis due to Rhinovirus 466.0 J20.6    079.3       Return if symptoms worsen or fail to improve.  Lysbeth Penner FNP

## 2014-05-19 NOTE — Telephone Encounter (Signed)
Made appt

## 2014-05-21 ENCOUNTER — Other Ambulatory Visit: Payer: Self-pay | Admitting: Family Medicine

## 2014-05-21 LAB — URINE CULTURE

## 2014-07-09 DIAGNOSIS — C8590 Non-Hodgkin lymphoma, unspecified, unspecified site: Secondary | ICD-10-CM | POA: Diagnosis not present

## 2014-07-09 DIAGNOSIS — Z5111 Encounter for antineoplastic chemotherapy: Secondary | ICD-10-CM | POA: Diagnosis not present

## 2014-07-16 DIAGNOSIS — K208 Other esophagitis: Secondary | ICD-10-CM | POA: Diagnosis not present

## 2014-07-16 DIAGNOSIS — B2790 Infectious mononucleosis, unspecified without complication: Secondary | ICD-10-CM | POA: Diagnosis not present

## 2014-07-16 DIAGNOSIS — Z853 Personal history of malignant neoplasm of breast: Secondary | ICD-10-CM | POA: Diagnosis not present

## 2014-07-16 DIAGNOSIS — C8589 Other specified types of non-Hodgkin lymphoma, extranodal and solid organ sites: Secondary | ICD-10-CM | POA: Diagnosis not present

## 2014-07-16 DIAGNOSIS — R630 Anorexia: Secondary | ICD-10-CM | POA: Diagnosis not present

## 2014-07-16 DIAGNOSIS — C8590 Non-Hodgkin lymphoma, unspecified, unspecified site: Secondary | ICD-10-CM | POA: Diagnosis not present

## 2014-07-16 DIAGNOSIS — M810 Age-related osteoporosis without current pathological fracture: Secondary | ICD-10-CM | POA: Diagnosis not present

## 2014-07-16 DIAGNOSIS — D472 Monoclonal gammopathy: Secondary | ICD-10-CM | POA: Diagnosis not present

## 2014-07-16 DIAGNOSIS — C8511 Unspecified B-cell lymphoma, lymph nodes of head, face, and neck: Secondary | ICD-10-CM | POA: Diagnosis not present

## 2014-07-16 DIAGNOSIS — M3500 Sicca syndrome, unspecified: Secondary | ICD-10-CM | POA: Diagnosis not present

## 2014-07-16 DIAGNOSIS — M1380 Other specified arthritis, unspecified site: Secondary | ICD-10-CM | POA: Diagnosis not present

## 2014-07-16 DIAGNOSIS — Z888 Allergy status to other drugs, medicaments and biological substances status: Secondary | ICD-10-CM | POA: Diagnosis not present

## 2014-07-16 DIAGNOSIS — C50919 Malignant neoplasm of unspecified site of unspecified female breast: Secondary | ICD-10-CM | POA: Diagnosis not present

## 2014-07-16 DIAGNOSIS — E039 Hypothyroidism, unspecified: Secondary | ICD-10-CM | POA: Diagnosis not present

## 2014-07-16 DIAGNOSIS — Z5111 Encounter for antineoplastic chemotherapy: Secondary | ICD-10-CM | POA: Diagnosis not present

## 2014-07-16 DIAGNOSIS — Z9011 Acquired absence of right breast and nipple: Secondary | ICD-10-CM | POA: Diagnosis not present

## 2014-07-16 DIAGNOSIS — F329 Major depressive disorder, single episode, unspecified: Secondary | ICD-10-CM | POA: Diagnosis not present

## 2014-07-16 DIAGNOSIS — K2950 Unspecified chronic gastritis without bleeding: Secondary | ICD-10-CM | POA: Diagnosis not present

## 2014-07-16 DIAGNOSIS — M899 Disorder of bone, unspecified: Secondary | ICD-10-CM | POA: Diagnosis not present

## 2014-07-23 DIAGNOSIS — C8590 Non-Hodgkin lymphoma, unspecified, unspecified site: Secondary | ICD-10-CM | POA: Diagnosis not present

## 2014-07-23 DIAGNOSIS — Z5111 Encounter for antineoplastic chemotherapy: Secondary | ICD-10-CM | POA: Diagnosis not present

## 2014-07-24 DIAGNOSIS — C44319 Basal cell carcinoma of skin of other parts of face: Secondary | ICD-10-CM | POA: Diagnosis not present

## 2014-07-24 DIAGNOSIS — D485 Neoplasm of uncertain behavior of skin: Secondary | ICD-10-CM | POA: Diagnosis not present

## 2014-07-30 DIAGNOSIS — Z5111 Encounter for antineoplastic chemotherapy: Secondary | ICD-10-CM | POA: Diagnosis not present

## 2014-07-30 DIAGNOSIS — C8590 Non-Hodgkin lymphoma, unspecified, unspecified site: Secondary | ICD-10-CM | POA: Diagnosis not present

## 2014-08-06 DIAGNOSIS — C8590 Non-Hodgkin lymphoma, unspecified, unspecified site: Secondary | ICD-10-CM | POA: Diagnosis not present

## 2014-08-06 DIAGNOSIS — Z5111 Encounter for antineoplastic chemotherapy: Secondary | ICD-10-CM | POA: Diagnosis not present

## 2014-08-13 DIAGNOSIS — D801 Nonfamilial hypogammaglobulinemia: Secondary | ICD-10-CM | POA: Diagnosis not present

## 2014-08-13 DIAGNOSIS — F329 Major depressive disorder, single episode, unspecified: Secondary | ICD-10-CM | POA: Diagnosis not present

## 2014-08-13 DIAGNOSIS — Z9011 Acquired absence of right breast and nipple: Secondary | ICD-10-CM | POA: Diagnosis not present

## 2014-08-13 DIAGNOSIS — M3500 Sicca syndrome, unspecified: Secondary | ICD-10-CM | POA: Diagnosis not present

## 2014-08-13 DIAGNOSIS — E039 Hypothyroidism, unspecified: Secondary | ICD-10-CM | POA: Diagnosis not present

## 2014-08-13 DIAGNOSIS — I10 Essential (primary) hypertension: Secondary | ICD-10-CM | POA: Diagnosis not present

## 2014-08-13 DIAGNOSIS — Z79899 Other long term (current) drug therapy: Secondary | ICD-10-CM | POA: Diagnosis not present

## 2014-08-13 DIAGNOSIS — Z5111 Encounter for antineoplastic chemotherapy: Secondary | ICD-10-CM | POA: Diagnosis not present

## 2014-08-13 DIAGNOSIS — C8590 Non-Hodgkin lymphoma, unspecified, unspecified site: Secondary | ICD-10-CM | POA: Diagnosis not present

## 2014-08-13 DIAGNOSIS — R591 Generalized enlarged lymph nodes: Secondary | ICD-10-CM | POA: Diagnosis not present

## 2014-08-13 DIAGNOSIS — R630 Anorexia: Secondary | ICD-10-CM | POA: Diagnosis not present

## 2014-09-27 ENCOUNTER — Ambulatory Visit (INDEPENDENT_AMBULATORY_CARE_PROVIDER_SITE_OTHER): Payer: Medicare Other | Admitting: General Practice

## 2014-09-27 VITALS — BP 111/74 | HR 107 | Temp 97.0°F | Ht 63.0 in | Wt 113.0 lb

## 2014-09-27 DIAGNOSIS — R197 Diarrhea, unspecified: Secondary | ICD-10-CM | POA: Diagnosis not present

## 2014-09-27 NOTE — Patient Instructions (Signed)

## 2014-09-27 NOTE — Progress Notes (Signed)
   Subjective:    Patient ID: Wendy Kelley, female    DOB: Nov 13, 1940, 74 y.o.   MRN: [id number]  Diarrhea  Pertinent negatives include no abdominal pain, chills, coughing, fever, headaches or vomiting.  Patient presents today with complaints of uncontrolled diarrhea, intermittently for past week. She is accompanied by her son. Patient reports 2 episodes of uncontrolled diarrhea over past week, but son thinks about 3 episodes. Both agree that last episode was two days ago, since then she has been having her normal loose stool (1-2 times daily). Reports currently receiving chemotherapy treatments and also taking levoflaxacin 500mg  1 daily. She reports taking antibiotic since 09/03/2014, prescribed by oncologist. Patient reports a history of chronic loose stools and has taken imodium in the past that was effective, no imodium in past several months. Patient and son report no known weight loss due to diarrhea. Patient is taking appetite stimulant and drinking ensures.   Patient denies feeling ill currently or with the episodes of diarrhea.    Review of Systems  Constitutional: Negative for fever, chills, activity change and appetite change.  HENT: Negative for congestion.   Respiratory: Negative for cough, chest tightness and shortness of breath.   Cardiovascular: Negative for chest pain and palpitations.  Gastrointestinal: Positive for diarrhea. Negative for nausea, vomiting, abdominal pain and blood in stool.  Genitourinary: Negative for urgency, flank pain and difficulty urinating.  Musculoskeletal: Negative for back pain.  Skin: Positive for pallor.  Neurological: Negative for dizziness, weakness and headaches.       Objective:   Physical Exam  Constitutional: She appears well-developed and well-nourished.  HENT:  Head: Normocephalic and atraumatic.  Right Ear: External ear normal.  Left Ear: External ear normal.  Cardiovascular: Regular rhythm and normal heart sounds.  Tachycardia  present.   Pulmonary/Chest: Effort normal. No respiratory distress. She has no wheezes. She exhibits no tenderness.  Slightly diminished breath sounds in bases  Abdominal: Soft. Bowel sounds are normal. There is no tenderness. There is no rebound.  Neurological: She is alert.  Skin: Skin is warm and dry.  Psychiatric: She has a normal mood and affect.          Assessment & Plan:  1. Diarrhea -discussed with patient and son, some causes of diarrhea and test that may determine cause (such as stool samples) -discussed using OTC imodium as directed, if symptoms return -If symptoms return or unresolved with imodium, contact Oncologist and PCP office immediately  -Provided and discussed educational material on diarrhea -discussed increased fluid intake, handwashing, foods/drinks that worsen diarrhea -May seek emergency medical treatment as needed -Patient and son verbalized understanding Erby Pian, FNP-C

## 2014-10-08 DIAGNOSIS — M3500 Sicca syndrome, unspecified: Secondary | ICD-10-CM | POA: Diagnosis not present

## 2014-10-08 DIAGNOSIS — F329 Major depressive disorder, single episode, unspecified: Secondary | ICD-10-CM | POA: Diagnosis not present

## 2014-10-08 DIAGNOSIS — M899 Disorder of bone, unspecified: Secondary | ICD-10-CM | POA: Diagnosis not present

## 2014-10-08 DIAGNOSIS — Z853 Personal history of malignant neoplasm of breast: Secondary | ICD-10-CM | POA: Diagnosis not present

## 2014-10-08 DIAGNOSIS — I2510 Atherosclerotic heart disease of native coronary artery without angina pectoris: Secondary | ICD-10-CM | POA: Diagnosis not present

## 2014-10-08 DIAGNOSIS — Z9011 Acquired absence of right breast and nipple: Secondary | ICD-10-CM | POA: Diagnosis not present

## 2014-10-08 DIAGNOSIS — T451X5A Adverse effect of antineoplastic and immunosuppressive drugs, initial encounter: Secondary | ICD-10-CM | POA: Diagnosis not present

## 2014-10-08 DIAGNOSIS — R918 Other nonspecific abnormal finding of lung field: Secondary | ICD-10-CM | POA: Diagnosis not present

## 2014-10-08 DIAGNOSIS — C8590 Non-Hodgkin lymphoma, unspecified, unspecified site: Secondary | ICD-10-CM | POA: Diagnosis not present

## 2014-10-08 DIAGNOSIS — C8598 Non-Hodgkin lymphoma, unspecified, lymph nodes of multiple sites: Secondary | ICD-10-CM | POA: Diagnosis not present

## 2014-10-08 DIAGNOSIS — K529 Noninfective gastroenteritis and colitis, unspecified: Secondary | ICD-10-CM | POA: Diagnosis not present

## 2014-10-08 DIAGNOSIS — R630 Anorexia: Secondary | ICD-10-CM | POA: Diagnosis not present

## 2014-10-08 DIAGNOSIS — Z79899 Other long term (current) drug therapy: Secondary | ICD-10-CM | POA: Diagnosis not present

## 2014-10-08 DIAGNOSIS — R42 Dizziness and giddiness: Secondary | ICD-10-CM | POA: Diagnosis not present

## 2014-10-08 DIAGNOSIS — D472 Monoclonal gammopathy: Secondary | ICD-10-CM | POA: Diagnosis not present

## 2014-10-08 DIAGNOSIS — Z7952 Long term (current) use of systemic steroids: Secondary | ICD-10-CM | POA: Diagnosis not present

## 2014-10-08 DIAGNOSIS — D801 Nonfamilial hypogammaglobulinemia: Secondary | ICD-10-CM | POA: Diagnosis not present

## 2014-10-08 DIAGNOSIS — C7951 Secondary malignant neoplasm of bone: Secondary | ICD-10-CM | POA: Diagnosis not present

## 2014-10-08 DIAGNOSIS — M810 Age-related osteoporosis without current pathological fracture: Secondary | ICD-10-CM | POA: Diagnosis not present

## 2014-10-08 DIAGNOSIS — Z9221 Personal history of antineoplastic chemotherapy: Secondary | ICD-10-CM | POA: Diagnosis not present

## 2014-10-08 DIAGNOSIS — E039 Hypothyroidism, unspecified: Secondary | ICD-10-CM | POA: Diagnosis not present

## 2014-10-08 DIAGNOSIS — Z9882 Breast implant status: Secondary | ICD-10-CM | POA: Diagnosis not present

## 2014-11-03 DIAGNOSIS — B079 Viral wart, unspecified: Secondary | ICD-10-CM | POA: Diagnosis not present

## 2014-11-03 DIAGNOSIS — L821 Other seborrheic keratosis: Secondary | ICD-10-CM | POA: Diagnosis not present

## 2014-11-03 DIAGNOSIS — C44319 Basal cell carcinoma of skin of other parts of face: Secondary | ICD-10-CM | POA: Diagnosis not present

## 2014-11-03 DIAGNOSIS — L570 Actinic keratosis: Secondary | ICD-10-CM | POA: Diagnosis not present

## 2014-12-01 DIAGNOSIS — R627 Adult failure to thrive: Secondary | ICD-10-CM | POA: Diagnosis not present

## 2014-12-01 DIAGNOSIS — R1110 Vomiting, unspecified: Secondary | ICD-10-CM | POA: Diagnosis not present

## 2014-12-01 DIAGNOSIS — R638 Other symptoms and signs concerning food and fluid intake: Secondary | ICD-10-CM | POA: Diagnosis not present

## 2014-12-01 DIAGNOSIS — R918 Other nonspecific abnormal finding of lung field: Secondary | ICD-10-CM | POA: Diagnosis not present

## 2014-12-01 DIAGNOSIS — D72819 Decreased white blood cell count, unspecified: Secondary | ICD-10-CM | POA: Diagnosis not present

## 2014-12-01 DIAGNOSIS — R32 Unspecified urinary incontinence: Secondary | ICD-10-CM | POA: Diagnosis not present

## 2014-12-01 DIAGNOSIS — R350 Frequency of micturition: Secondary | ICD-10-CM | POA: Diagnosis not present

## 2014-12-01 DIAGNOSIS — D701 Agranulocytosis secondary to cancer chemotherapy: Secondary | ICD-10-CM | POA: Diagnosis not present

## 2014-12-01 DIAGNOSIS — M549 Dorsalgia, unspecified: Secondary | ICD-10-CM | POA: Diagnosis not present

## 2014-12-01 DIAGNOSIS — E039 Hypothyroidism, unspecified: Secondary | ICD-10-CM | POA: Diagnosis not present

## 2014-12-01 DIAGNOSIS — R0602 Shortness of breath: Secondary | ICD-10-CM | POA: Diagnosis not present

## 2014-12-01 DIAGNOSIS — D709 Neutropenia, unspecified: Secondary | ICD-10-CM | POA: Diagnosis not present

## 2014-12-01 DIAGNOSIS — R079 Chest pain, unspecified: Secondary | ICD-10-CM | POA: Diagnosis not present

## 2014-12-01 DIAGNOSIS — C8590 Non-Hodgkin lymphoma, unspecified, unspecified site: Secondary | ICD-10-CM | POA: Diagnosis not present

## 2014-12-01 DIAGNOSIS — T451X5A Adverse effect of antineoplastic and immunosuppressive drugs, initial encounter: Secondary | ICD-10-CM | POA: Diagnosis not present

## 2014-12-01 DIAGNOSIS — M3500 Sicca syndrome, unspecified: Secondary | ICD-10-CM | POA: Diagnosis not present

## 2014-12-01 DIAGNOSIS — R071 Chest pain on breathing: Secondary | ICD-10-CM | POA: Diagnosis not present

## 2014-12-01 DIAGNOSIS — F329 Major depressive disorder, single episode, unspecified: Secondary | ICD-10-CM | POA: Diagnosis not present

## 2014-12-01 DIAGNOSIS — Z853 Personal history of malignant neoplasm of breast: Secondary | ICD-10-CM | POA: Diagnosis not present

## 2014-12-02 DIAGNOSIS — R32 Unspecified urinary incontinence: Secondary | ICD-10-CM | POA: Diagnosis not present

## 2014-12-02 DIAGNOSIS — R918 Other nonspecific abnormal finding of lung field: Secondary | ICD-10-CM | POA: Diagnosis not present

## 2014-12-02 DIAGNOSIS — E039 Hypothyroidism, unspecified: Secondary | ICD-10-CM | POA: Diagnosis not present

## 2014-12-02 DIAGNOSIS — T451X5A Adverse effect of antineoplastic and immunosuppressive drugs, initial encounter: Secondary | ICD-10-CM | POA: Diagnosis not present

## 2014-12-02 DIAGNOSIS — D701 Agranulocytosis secondary to cancer chemotherapy: Secondary | ICD-10-CM | POA: Diagnosis not present

## 2014-12-02 DIAGNOSIS — F329 Major depressive disorder, single episode, unspecified: Secondary | ICD-10-CM | POA: Diagnosis not present

## 2014-12-02 DIAGNOSIS — C8590 Non-Hodgkin lymphoma, unspecified, unspecified site: Secondary | ICD-10-CM | POA: Diagnosis not present

## 2014-12-02 DIAGNOSIS — Z853 Personal history of malignant neoplasm of breast: Secondary | ICD-10-CM | POA: Diagnosis not present

## 2014-12-02 DIAGNOSIS — R627 Adult failure to thrive: Secondary | ICD-10-CM | POA: Diagnosis not present

## 2014-12-02 DIAGNOSIS — D72819 Decreased white blood cell count, unspecified: Secondary | ICD-10-CM | POA: Diagnosis not present

## 2014-12-02 DIAGNOSIS — M3500 Sicca syndrome, unspecified: Secondary | ICD-10-CM | POA: Diagnosis not present

## 2014-12-02 DIAGNOSIS — C884 Extranodal marginal zone B-cell lymphoma of mucosa-associated lymphoid tissue [MALT-lymphoma]: Secondary | ICD-10-CM | POA: Diagnosis not present

## 2014-12-03 DIAGNOSIS — R32 Unspecified urinary incontinence: Secondary | ICD-10-CM | POA: Diagnosis not present

## 2014-12-03 DIAGNOSIS — D72819 Decreased white blood cell count, unspecified: Secondary | ICD-10-CM | POA: Diagnosis not present

## 2014-12-03 DIAGNOSIS — D61818 Other pancytopenia: Secondary | ICD-10-CM | POA: Diagnosis not present

## 2014-12-03 DIAGNOSIS — M3500 Sicca syndrome, unspecified: Secondary | ICD-10-CM | POA: Diagnosis not present

## 2014-12-03 DIAGNOSIS — T451X5A Adverse effect of antineoplastic and immunosuppressive drugs, initial encounter: Secondary | ICD-10-CM | POA: Diagnosis not present

## 2014-12-03 DIAGNOSIS — D701 Agranulocytosis secondary to cancer chemotherapy: Secondary | ICD-10-CM | POA: Diagnosis not present

## 2014-12-03 DIAGNOSIS — C8590 Non-Hodgkin lymphoma, unspecified, unspecified site: Secondary | ICD-10-CM | POA: Diagnosis not present

## 2014-12-03 DIAGNOSIS — E039 Hypothyroidism, unspecified: Secondary | ICD-10-CM | POA: Diagnosis not present

## 2014-12-03 DIAGNOSIS — R918 Other nonspecific abnormal finding of lung field: Secondary | ICD-10-CM | POA: Diagnosis not present

## 2014-12-03 DIAGNOSIS — C884 Extranodal marginal zone B-cell lymphoma of mucosa-associated lymphoid tissue [MALT-lymphoma]: Secondary | ICD-10-CM | POA: Diagnosis not present

## 2014-12-03 DIAGNOSIS — R627 Adult failure to thrive: Secondary | ICD-10-CM | POA: Diagnosis not present

## 2014-12-03 DIAGNOSIS — F329 Major depressive disorder, single episode, unspecified: Secondary | ICD-10-CM | POA: Diagnosis not present

## 2014-12-03 DIAGNOSIS — Z853 Personal history of malignant neoplasm of breast: Secondary | ICD-10-CM | POA: Diagnosis not present

## 2014-12-10 DIAGNOSIS — C8590 Non-Hodgkin lymphoma, unspecified, unspecified site: Secondary | ICD-10-CM | POA: Diagnosis not present

## 2014-12-10 DIAGNOSIS — D709 Neutropenia, unspecified: Secondary | ICD-10-CM | POA: Diagnosis not present

## 2014-12-19 DIAGNOSIS — Z5111 Encounter for antineoplastic chemotherapy: Secondary | ICD-10-CM | POA: Diagnosis not present

## 2014-12-19 DIAGNOSIS — C8590 Non-Hodgkin lymphoma, unspecified, unspecified site: Secondary | ICD-10-CM | POA: Diagnosis not present

## 2014-12-23 DIAGNOSIS — C44319 Basal cell carcinoma of skin of other parts of face: Secondary | ICD-10-CM | POA: Diagnosis not present

## 2015-01-10 ENCOUNTER — Ambulatory Visit (INDEPENDENT_AMBULATORY_CARE_PROVIDER_SITE_OTHER): Payer: Medicare Other | Admitting: Physician Assistant

## 2015-01-10 ENCOUNTER — Encounter: Payer: Self-pay | Admitting: Physician Assistant

## 2015-01-10 VITALS — BP 115/69 | HR 110 | Temp 97.5°F | Ht 63.0 in | Wt 115.6 lb

## 2015-01-10 DIAGNOSIS — R32 Unspecified urinary incontinence: Secondary | ICD-10-CM

## 2015-01-10 DIAGNOSIS — L03116 Cellulitis of left lower limb: Secondary | ICD-10-CM | POA: Diagnosis not present

## 2015-01-10 DIAGNOSIS — L089 Local infection of the skin and subcutaneous tissue, unspecified: Secondary | ICD-10-CM

## 2015-01-10 LAB — POCT URINALYSIS DIPSTICK
Blood, UA: NEGATIVE
Glucose, UA: NEGATIVE
Ketones, UA: NEGATIVE
NITRITE UA: NEGATIVE
PH UA: 6
Spec Grav, UA: 1.015
Urobilinogen, UA: 2

## 2015-01-10 LAB — POCT UA - MICROSCOPIC ONLY
CASTS, UR, LPF, POC: NEGATIVE
CRYSTALS, UR, HPF, POC: NEGATIVE
MUCUS UA: NEGATIVE
Yeast, UA: NEGATIVE

## 2015-01-10 MED ORDER — SULFAMETHOXAZOLE-TRIMETHOPRIM 800-160 MG PO TABS
1.0000 | ORAL_TABLET | Freq: Two times a day (BID) | ORAL | Status: DC
Start: 2015-01-10 — End: 2015-04-07

## 2015-01-10 MED ORDER — DOXYCYCLINE HYCLATE 100 MG PO TABS: 100.0000 mg | ORAL_TABLET | Freq: Two times a day (BID) | ORAL | Status: DC

## 2015-01-10 NOTE — Progress Notes (Signed)
   Subjective:    Patient ID: Wendy Kelley, female    DOB: May 02, 1941, 74 y.o.   MRN: [id number]  HPI 74 y/o female presents with non-healing  lesion on left leg x 1+week. It appears to be gettng larger and increased redness. Has been using neosporin with no improvenment .She is unsure if she hit her leg or had a spider bite,etc.     Review of Systems  Constitutional: Negative.   Gastrointestinal: Negative for nausea, vomiting, abdominal pain and diarrhea.  Skin: Positive for color change and wound (wound on left shin, mild pain, increasing redness ).  Neurological: Negative for dizziness, light-headedness and headaches.       Objective:   Physical Exam  Constitutional: She is oriented to person, place, and time. She appears well-developed.  Malnourished, frail  Neurological: She is alert and oriented to person, place, and time.  Skin:  cm ulcer on LLE, medial shin with surrounding erythema and inflammation  Approximately 1 cm circumferential   Psychiatric: She has a normal mood and affect. Her behavior is normal. Judgment and thought content normal.  Nursing note and vitals reviewed.         Assessment & Plan:  1. Urinary incontinence, unspecified incontinence type  - POCT UA - Microscopic Only - POCT urinalysis dipstick - culture ordered, doxycycline given for skin infection. Will change or add additional coverage if needed when culture returns based on sensitivity to Doxy  2. Skin infection  - doxycycline (VIBRA-TABS) 100 MG tablet; Take 1 tablet (100 mg total) by mouth 2 (two) times daily. With food  Dispense: 20 tablet; Refill: 0  3. Cellulitis of left lower extremity - Tegaderm applied in office. Will have patient rtc in 4 days  - doxycycline (VIBRA-TABS) 100 MG tablet; Take 1 tablet (100 mg total) by mouth 2 (two) times daily. With food  Dispense: 20 tablet; Refill: 0   Continue all meds Labs pending Health Maintenance reviewed Diet and exercise  encouraged RTO 4 days   Tiffany A. Benjamin Stain PA-C

## 2015-01-13 LAB — URINE CULTURE

## 2015-01-14 ENCOUNTER — Encounter: Payer: Self-pay | Admitting: Physician Assistant

## 2015-01-14 ENCOUNTER — Ambulatory Visit (INDEPENDENT_AMBULATORY_CARE_PROVIDER_SITE_OTHER): Payer: Medicare Other | Admitting: Physician Assistant

## 2015-01-14 VITALS — BP 94/61 | HR 107 | Temp 97.7°F | Ht 63.0 in

## 2015-01-14 DIAGNOSIS — L97929 Non-pressure chronic ulcer of unspecified part of left lower leg with unspecified severity: Secondary | ICD-10-CM

## 2015-01-14 LAB — AEROBIC CULTURE

## 2015-01-14 NOTE — Progress Notes (Signed)
   Subjective:    Patient ID: Wendy Kelley, female    DOB: [birth year], 74 y.o.   MRN: [id number]  HPI 74 y/o female presents for follow up of leg ulcer on left shin. She was seen in office 4 days ago. Bacterial culture was taken but results have not came back. She was empirically started on Bactrim DS BID x 10 days and tegaderm dressing was applied. She states that the lesion has not gotten worse and she is not having any pain. She is very immunocompromised due to recent chemo and long term Prednisone treatment.     Review of Systems  Constitutional: Negative.   HENT: Negative.   Eyes: Negative.   Respiratory: Negative.   Cardiovascular: Negative.   Gastrointestinal: Negative.   Skin: Positive for wound (left shin with surrounding redness ).       Denies pain or itch        Objective:   Physical Exam  Constitutional: She is oriented to person, place, and time.  Musculoskeletal: She exhibits no edema.  Neurological: She is alert and oriented to person, place, and time.  Skin:  Ulcer on left anterior shin with surrrounding inflammation and erythema  Tegaderm in place with markings from previous visit. Redness has not spread past markings  Psychiatric: She has a normal mood and affect. Her behavior is normal. Judgment and thought content normal.          Assessment & Plan:  1. Ulcer of left lower leg, with unspecified severity - Continue Bactrim   Follow up in 1 weeks for recheck   Lorimer Tiberio A. Benjamin Stain PA-C

## 2015-01-15 ENCOUNTER — Other Ambulatory Visit: Payer: Self-pay | Admitting: Physician Assistant

## 2015-01-15 DIAGNOSIS — N3090 Cystitis, unspecified without hematuria: Secondary | ICD-10-CM

## 2015-01-15 MED ORDER — NITROFURANTOIN MONOHYD MACRO 100 MG PO CAPS: 100.0000 mg | ORAL_CAPSULE | Freq: Two times a day (BID) | ORAL | Status: DC

## 2015-01-21 ENCOUNTER — Ambulatory Visit (INDEPENDENT_AMBULATORY_CARE_PROVIDER_SITE_OTHER): Payer: Medicare Other | Admitting: Physician Assistant

## 2015-01-21 ENCOUNTER — Encounter: Payer: Self-pay | Admitting: Physician Assistant

## 2015-01-21 VITALS — Ht 63.0 in

## 2015-01-21 DIAGNOSIS — L97921 Non-pressure chronic ulcer of unspecified part of left lower leg limited to breakdown of skin: Secondary | ICD-10-CM

## 2015-01-21 DIAGNOSIS — L8992 Pressure ulcer of unspecified site, stage 2: Secondary | ICD-10-CM

## 2015-01-21 NOTE — Progress Notes (Signed)
   Subjective:    Patient ID: Wendy Kelley, female    DOB: [birth year], 74 y.o.   MRN: [id number]  HPI74 y/o female presents for 1 week follow up after application of Unna boot for nonhealing lesion on left leg .She is very immunocompromised due to recent cancer treatment.      Review of Systems  Skin:       Denies pain, edema of LLE        Objective:   Physical Exam  Skin: There is erythema.  Ulcer on LLE. Approximately 1.5cm in diameter. Inflamed borders with erythema.  Erythema surrounding ulcer decreased from initial visit.          Assessment & Plan:  1. Ulcer of left lower leg, limited to breakdown of skin  - Apply unna boot; Standing - Apply unna boot  2. Pressure ulcer stage II  - Apply unna boot; Standing - Apply unna boot   Continue all meds Labs pending Health Maintenance reviewed Diet and exercise encouraged RTO 1 week   Tere Mcconaughey A. Benjamin Stain PA-C

## 2015-01-23 ENCOUNTER — Other Ambulatory Visit: Payer: Self-pay | Admitting: Physician Assistant

## 2015-01-23 ENCOUNTER — Telehealth: Payer: Self-pay | Admitting: Physician Assistant

## 2015-01-23 NOTE — Telephone Encounter (Signed)
Patient notified that rx sent to pharmacy 

## 2015-01-23 NOTE — Telephone Encounter (Signed)
According to her chart, she was prescribed Diflucan once daily by her dr at [facility] on 01/10/15 while she was neutropenic. Does she have this? If so, her best treatment would be otc Monistat. If not, send over script for Diflucan 150mg . Repeat dose in 3 days. Disp#2 No rf  Tiffany A. Benjamin Stain PA-C

## 2015-01-29 ENCOUNTER — Encounter: Payer: Self-pay | Admitting: Physician Assistant

## 2015-01-29 ENCOUNTER — Ambulatory Visit (INDEPENDENT_AMBULATORY_CARE_PROVIDER_SITE_OTHER): Payer: Medicare Other | Admitting: Physician Assistant

## 2015-01-29 VITALS — BP 98/65 | HR 108 | Temp 98.3°F

## 2015-01-29 DIAGNOSIS — L97921 Non-pressure chronic ulcer of unspecified part of left lower leg limited to breakdown of skin: Secondary | ICD-10-CM

## 2015-01-29 DIAGNOSIS — B373 Candidiasis of vulva and vagina: Secondary | ICD-10-CM | POA: Diagnosis not present

## 2015-01-29 DIAGNOSIS — L8992 Pressure ulcer of unspecified site, stage 2: Secondary | ICD-10-CM | POA: Diagnosis not present

## 2015-01-29 DIAGNOSIS — B3731 Acute candidiasis of vulva and vagina: Secondary | ICD-10-CM

## 2015-01-29 MED ORDER — KETOCONAZOLE 2 % EX CREA: 1.0000 "application " | TOPICAL_CREAM | Freq: Two times a day (BID) | CUTANEOUS | Status: DC

## 2015-01-29 MED ORDER — MUPIROCIN 2 % EX OINT: TOPICAL_OINTMENT | CUTANEOUS | Status: DC

## 2015-01-29 NOTE — Progress Notes (Signed)
Patient ID: Wendy Kelley, female   DOB: 1941-03-06, 74 y.o.   MRN: [id number]   74 y/o female presents for follow up of ulcer on shin of LLE. She has had an unna boot applied the past two weeks. Despite her immunosuppression due to ulcer appears to be healing. Determined to be a pressure ulcer d/t her crossing her legs and resting one on top of the other.   Unna wrap C/D/I. New granulation tissue around edges of wound. Scab in center of wound debrided and new unna wrap applied. She will f/u in 1 week for recheck and determination of further treatment.   Ketoconazole prescribed for vulvovaginal candidiasis d/t her taking antibiotic. I have advised patient to use ketoconazole BID on dry skin and cover with zinc oxide to provide a moisture barrier.   Tristine Langi A. Benjamin Stain PA-C

## 2015-01-29 NOTE — Patient Instructions (Signed)
Apply ketoconazole cream to bottom and groin area - make sure skin is very dry prior to application, no moisture After applying liberal application of ketoconazole, cover with zinc oxide cream

## 2015-01-30 ENCOUNTER — Telehealth: Payer: Self-pay | Admitting: Physician Assistant

## 2015-02-02 DIAGNOSIS — C8590 Non-Hodgkin lymphoma, unspecified, unspecified site: Secondary | ICD-10-CM | POA: Diagnosis not present

## 2015-02-02 NOTE — Telephone Encounter (Signed)
Please r/c to explain to [place] what you need concerning medications. Previous message unclear.

## 2015-02-03 NOTE — Telephone Encounter (Signed)
Per Tiffany's office note she should be using zinc oxide.

## 2015-02-05 ENCOUNTER — Ambulatory Visit (INDEPENDENT_AMBULATORY_CARE_PROVIDER_SITE_OTHER): Payer: Medicare Other | Admitting: Physician Assistant

## 2015-02-05 ENCOUNTER — Encounter: Payer: Self-pay | Admitting: Physician Assistant

## 2015-02-05 VITALS — BP 112/66 | HR 112 | Temp 97.8°F | Ht 63.0 in

## 2015-02-05 DIAGNOSIS — L97921 Non-pressure chronic ulcer of unspecified part of left lower leg limited to breakdown of skin: Secondary | ICD-10-CM

## 2015-02-05 NOTE — Patient Instructions (Signed)
Apply Mupirocin ointment twice to three times daily. Wash gentle with dove soap and water, Pat, dont wipe. Cover with nonstick bandage.

## 2015-02-05 NOTE — Progress Notes (Signed)
Patient ID: Wendy Kelley, female   DOB: September 13, 1940, 74 y.o.   MRN: [id number]  74 y/o immunosuppressed female presents for follow up of ulcer or left shin. She has been treated with Unna wrap. She is much improved since initial visit. Denies pain.   Unna wrap removed. C/D/I. Wound appears to be healing well on left shin. Small center scab with mild surrounding erythema. Nonulcerative.   I do not feel that patient needs additional Unna today. I have advised her to apply Mupirocin ointment BID-TID cover with non-stick gauze. Remind patient to not cross legs. Try to put pillow between legs to prevent additional pressure ulcers.   F/U in 2 weeks unless wound worsens. Will put unna wrap on if needed.

## 2015-02-11 DIAGNOSIS — F329 Major depressive disorder, single episode, unspecified: Secondary | ICD-10-CM | POA: Diagnosis not present

## 2015-02-11 DIAGNOSIS — Z91048 Other nonmedicinal substance allergy status: Secondary | ICD-10-CM | POA: Diagnosis not present

## 2015-02-11 DIAGNOSIS — Z9011 Acquired absence of right breast and nipple: Secondary | ICD-10-CM | POA: Diagnosis not present

## 2015-02-11 DIAGNOSIS — I2510 Atherosclerotic heart disease of native coronary artery without angina pectoris: Secondary | ICD-10-CM | POA: Diagnosis not present

## 2015-02-11 DIAGNOSIS — Z8572 Personal history of non-Hodgkin lymphomas: Secondary | ICD-10-CM | POA: Diagnosis not present

## 2015-02-11 DIAGNOSIS — Z79899 Other long term (current) drug therapy: Secondary | ICD-10-CM | POA: Diagnosis not present

## 2015-02-11 DIAGNOSIS — Z85828 Personal history of other malignant neoplasm of skin: Secondary | ICD-10-CM | POA: Diagnosis not present

## 2015-02-11 DIAGNOSIS — D61810 Antineoplastic chemotherapy induced pancytopenia: Secondary | ICD-10-CM | POA: Diagnosis not present

## 2015-02-11 DIAGNOSIS — D709 Neutropenia, unspecified: Secondary | ICD-10-CM | POA: Diagnosis not present

## 2015-02-11 DIAGNOSIS — Z91041 Radiographic dye allergy status: Secondary | ICD-10-CM | POA: Diagnosis not present

## 2015-02-11 DIAGNOSIS — Z9221 Personal history of antineoplastic chemotherapy: Secondary | ICD-10-CM | POA: Diagnosis not present

## 2015-02-11 DIAGNOSIS — T451X5A Adverse effect of antineoplastic and immunosuppressive drugs, initial encounter: Secondary | ICD-10-CM | POA: Diagnosis not present

## 2015-02-11 DIAGNOSIS — R630 Anorexia: Secondary | ICD-10-CM | POA: Diagnosis not present

## 2015-02-11 DIAGNOSIS — E041 Nontoxic single thyroid nodule: Secondary | ICD-10-CM | POA: Diagnosis not present

## 2015-02-11 DIAGNOSIS — Z08 Encounter for follow-up examination after completed treatment for malignant neoplasm: Secondary | ICD-10-CM | POA: Diagnosis not present

## 2015-02-11 DIAGNOSIS — C8580 Other specified types of non-Hodgkin lymphoma, unspecified site: Secondary | ICD-10-CM | POA: Diagnosis not present

## 2015-02-11 DIAGNOSIS — Z853 Personal history of malignant neoplasm of breast: Secondary | ICD-10-CM | POA: Diagnosis not present

## 2015-02-18 ENCOUNTER — Ambulatory Visit (INDEPENDENT_AMBULATORY_CARE_PROVIDER_SITE_OTHER): Payer: Medicare Other | Admitting: Physician Assistant

## 2015-02-18 ENCOUNTER — Encounter: Payer: Self-pay | Admitting: Physician Assistant

## 2015-02-18 VITALS — BP 108/66 | HR 108 | Temp 97.6°F

## 2015-02-18 DIAGNOSIS — L97921 Non-pressure chronic ulcer of unspecified part of left lower leg limited to breakdown of skin: Secondary | ICD-10-CM

## 2015-02-18 DIAGNOSIS — R32 Unspecified urinary incontinence: Secondary | ICD-10-CM | POA: Diagnosis not present

## 2015-02-18 DIAGNOSIS — S81802D Unspecified open wound, left lower leg, subsequent encounter: Secondary | ICD-10-CM

## 2015-02-18 LAB — POCT UA - MICROSCOPIC ONLY
BACTERIA, U MICROSCOPIC: NEGATIVE
CRYSTALS, UR, HPF, POC: NEGATIVE
Casts, Ur, LPF, POC: NEGATIVE
MUCUS UA: NEGATIVE
RBC, urine, microscopic: NEGATIVE
Yeast, UA: NEGATIVE

## 2015-02-18 LAB — POCT URINALYSIS DIPSTICK
Blood, UA: NEGATIVE
GLUCOSE UA: NEGATIVE
KETONES UA: NEGATIVE
LEUKOCYTES UA: NEGATIVE
Nitrite, UA: NEGATIVE
SPEC GRAV UA: 1.025
Urobilinogen, UA: NEGATIVE
pH, UA: 6.5

## 2015-02-18 NOTE — Progress Notes (Signed)
   Subjective:    Patient ID: Wendy Kelley, female    DOB: 1941-01-03, 74 y.o.   MRN: [id number]  HPI 74 y/o female presents for non healing wound on left shin. SHe has been treated with unna boot x several weeks with success. Pressure ulcer was determined to start d/t patient crossing her legs. She is more susceptible d/t her immunocompromise.   Daughter in law states that patient has urinary incontinence and has a rash on her inner thigh.   She also needs to be reassessed for UTI.     Review of Systems  Genitourinary:       Urinary incontinence   Skin: Positive for wound (left shin. rash on medial thighs).       Objective:   Physical Exam  Constitutional:  Presents in wheelchair. Frail and fragile appearing   Nursing note and vitals reviewed.   Healing ulcer on left anterior shin. Moist with mild erythema. Less than 1cm in diameter.   Results for orders placed or performed in visit on 02/18/15  POCT UA - Microscopic Only  Result Value Ref Range   WBC, Ur, HPF, POC 3-5    RBC, urine, microscopic neg    Bacteria, U Microscopic neg    Mucus, UA neg    Epithelial cells, urine per micros occ    Crystals, Ur, HPF, POC neg    Casts, Ur, LPF, POC neg    Yeast, UA neg   POCT urinalysis dipstick  Result Value Ref Range   Color, UA gold    Clarity, UA clear    Glucose, UA neg    Bilirubin, UA small    Ketones, UA neg    Spec Grav, UA 1.025    Blood, UA neg    pH, UA 6.5    Protein, UA trace    Urobilinogen, UA negative    Nitrite, UA neg    Leukocytes, UA Negative Negative          Assessment & Plan:  1. Incontinence  - POCT UA - Microscopic Only - POCT urinalysis dipstick   2. Wound left leg - continue to apply mupirocin ointment BID. May leave uncovered during the day. Follow up if increased erythema or skin breakdown occurs.   F/U prn   Racheal Mathurin A. Benjamin Stain PA-C

## 2015-02-19 DIAGNOSIS — R590 Localized enlarged lymph nodes: Secondary | ICD-10-CM | POA: Diagnosis not present

## 2015-02-19 DIAGNOSIS — K449 Diaphragmatic hernia without obstruction or gangrene: Secondary | ICD-10-CM | POA: Diagnosis not present

## 2015-02-19 DIAGNOSIS — R918 Other nonspecific abnormal finding of lung field: Secondary | ICD-10-CM | POA: Diagnosis not present

## 2015-02-19 DIAGNOSIS — C8590 Non-Hodgkin lymphoma, unspecified, unspecified site: Secondary | ICD-10-CM | POA: Diagnosis not present

## 2015-02-19 DIAGNOSIS — K429 Umbilical hernia without obstruction or gangrene: Secondary | ICD-10-CM | POA: Diagnosis not present

## 2015-02-19 DIAGNOSIS — K862 Cyst of pancreas: Secondary | ICD-10-CM | POA: Diagnosis not present

## 2015-02-25 DIAGNOSIS — Z7952 Long term (current) use of systemic steroids: Secondary | ICD-10-CM | POA: Diagnosis not present

## 2015-02-25 DIAGNOSIS — Z91048 Other nonmedicinal substance allergy status: Secondary | ICD-10-CM | POA: Diagnosis not present

## 2015-02-25 DIAGNOSIS — Z853 Personal history of malignant neoplasm of breast: Secondary | ICD-10-CM | POA: Diagnosis not present

## 2015-02-25 DIAGNOSIS — D801 Nonfamilial hypogammaglobulinemia: Secondary | ICD-10-CM | POA: Diagnosis not present

## 2015-02-25 DIAGNOSIS — M3500 Sicca syndrome, unspecified: Secondary | ICD-10-CM | POA: Diagnosis not present

## 2015-02-25 DIAGNOSIS — Z79899 Other long term (current) drug therapy: Secondary | ICD-10-CM | POA: Diagnosis not present

## 2015-02-25 DIAGNOSIS — F329 Major depressive disorder, single episode, unspecified: Secondary | ICD-10-CM | POA: Diagnosis not present

## 2015-02-25 DIAGNOSIS — C8200 Follicular lymphoma grade I, unspecified site: Secondary | ICD-10-CM | POA: Diagnosis not present

## 2015-02-25 DIAGNOSIS — D61818 Other pancytopenia: Secondary | ICD-10-CM | POA: Diagnosis not present

## 2015-02-25 DIAGNOSIS — M899 Disorder of bone, unspecified: Secondary | ICD-10-CM | POA: Diagnosis not present

## 2015-03-16 ENCOUNTER — Ambulatory Visit: Payer: Medicare Other | Admitting: Pediatrics

## 2015-03-18 ENCOUNTER — Encounter: Payer: Self-pay | Admitting: Physician Assistant

## 2015-03-18 ENCOUNTER — Ambulatory Visit (INDEPENDENT_AMBULATORY_CARE_PROVIDER_SITE_OTHER): Payer: Medicare Other | Admitting: Physician Assistant

## 2015-03-18 ENCOUNTER — Ambulatory Visit (INDEPENDENT_AMBULATORY_CARE_PROVIDER_SITE_OTHER): Payer: Medicare Other

## 2015-03-18 ENCOUNTER — Other Ambulatory Visit: Payer: Self-pay | Admitting: Physician Assistant

## 2015-03-18 ENCOUNTER — Ambulatory Visit: Payer: Medicare Other | Admitting: Family Medicine

## 2015-03-18 ENCOUNTER — Telehealth: Payer: Self-pay | Admitting: Physician Assistant

## 2015-03-18 ENCOUNTER — Encounter (INDEPENDENT_AMBULATORY_CARE_PROVIDER_SITE_OTHER): Payer: Self-pay

## 2015-03-18 VITALS — BP 106/68 | HR 116 | Temp 97.0°F | Ht 63.0 in

## 2015-03-18 DIAGNOSIS — R062 Wheezing: Secondary | ICD-10-CM

## 2015-03-18 DIAGNOSIS — R0602 Shortness of breath: Secondary | ICD-10-CM

## 2015-03-18 DIAGNOSIS — J029 Acute pharyngitis, unspecified: Secondary | ICD-10-CM | POA: Diagnosis not present

## 2015-03-18 DIAGNOSIS — J069 Acute upper respiratory infection, unspecified: Secondary | ICD-10-CM | POA: Diagnosis not present

## 2015-03-18 LAB — POCT RAPID STREP A (OFFICE): Rapid Strep A Screen: NEGATIVE

## 2015-03-18 MED ORDER — LEVOFLOXACIN 750 MG PO TABS: 750.0000 mg | ORAL_TABLET | Freq: Every day | ORAL | Status: DC

## 2015-03-18 MED ORDER — PREDNISONE 10 MG (21) PO TBPK
ORAL_TABLET | ORAL | Status: DC
Start: 2015-03-18 — End: 2015-04-02

## 2015-03-18 NOTE — Telephone Encounter (Signed)
Call given to nurse °

## 2015-03-18 NOTE — Progress Notes (Signed)
Subjective:    Patient ID: Wendy Kelley, female    DOB: 04-Jul-1941, 74 y.o.   MRN: [id number]  HPI 74 y/o female presents with c/o increased fatigue, SOB x 2 days. She has numerous comorbidities and is  Very immunosuppressed d/t undergoing cancer treatment with chemo.      Review of Systems  Constitutional: Positive for chills.  HENT: Positive for congestion, postnasal drip, sneezing and sore throat.   Respiratory: Positive for cough and shortness of breath.   Cardiovascular: Negative.   Gastrointestinal: Negative.   Endocrine: Negative.   Genitourinary: Negative.   Musculoskeletal: Negative.   Skin: Negative.   Allergic/Immunologic: Negative.   Neurological: Negative.   Hematological: Negative.   Psychiatric/Behavioral: Negative.        Objective:   Physical Exam  Constitutional: She is oriented to person, place, and time. No distress.  malnourished  Cardiovascular: Regular rhythm and normal heart sounds.  Exam reveals no gallop and no friction rub.   No murmur heard. Tachycardic    Pulmonary/Chest: Effort normal. She has wheezes.  Neurological: She is alert and oriented to person, place, and time.  Skin: She is not diaphoretic.  Psychiatric: She has a normal mood and affect.  Nursing note and vitals reviewed.   Chest xray indicated New ill-defined density seen laterally in the right upper lobe which may represent focal atelectasis or pneumonia. Short-term follow-up radiographs are recommended to ensure resolution and rule out the possibility of underlying mass or neoplasm      Assessment & Plan:  1. Sore throat  - POCT rapid strep A - Upper Respiratory Culture, Routine - DG Chest 2 View - Replace Prednisone 5mg  q day with predniSONE (STERAPRED UNI-PAK 21 TAB) 10 MG (21) TBPK tablet; 6 pills PO on day 1, 5 on day 2, 4 on day 3, 3 on day 4, 2 on day 5, 1 on day 6  Dispense: 21 tablet; Refill: 0 - Stop current dosage of Levaquin 500mg . Replace with  levofloxacin (LEVAQUIN) 750 MG tablet; Take 1 tablet (750 mg total) by mouth daily. Do not take the 500mg  you have while taking this med  Dispense: 7 tablet; Refill: 0 Return to taking 500mg  when finished.   2. SOB (shortness of breath)  - DG Chest 2 View - Replace current dose of Prednisone 5mg  with predniSONE (STERAPRED UNI-PAK 21 TAB) 10 MG (21) TBPK tablet; 6 pills PO on day 1, 5 on day 2, 4 on day 3, 3 on day 4, 2 on day 5, 1 on day 6  Dispense: 21 tablet; Refill: 0 - levofloxacin (LEVAQUIN) 750 MG tablet; Take 1 tablet (750 mg total) by mouth daily. Do not take the 500mg  you have while taking this med  Dispense: 7 tablet; Refill: 0  3. Wheeze  - DG Chest 2 View - predniSONE (STERAPRED UNI-PAK 21 TAB) 10 MG (21) TBPK tablet; 6 pills PO on day 1, 5 on day 2, 4 on day 3, 3 on day 4, 2 on day 5, 1 on day 6  Dispense: 21 tablet; Refill: 0 - levofloxacin (LEVAQUIN) 750 MG tablet; Take 1 tablet (750 mg total) by mouth daily. Do not take the 500mg  you have while taking this med  Dispense: 7 tablet; Refill: 0  4. Acute upper respiratory infection   Add otc plain musinex and zyrtec or claritin  Follow up in 2 weeks for repeat chest xray to r/o underlyng mass.  - predniSONE (STERAPRED UNI-PAK 21 TAB) 10 MG (21) TBPK  tablet; 6 pills PO on day 1, 5 on day 2, 4 on day 3, 3 on day 4, 2 on day 5, 1 on day 6  Dispense: 21 tablet; Refill: 0 - levofloxacin (LEVAQUIN) 750 MG tablet; Take 1 tablet (750 mg total) by mouth daily. Do not take the 500mg  you have while taking this med  Dispense: 7 tablet; Refill: 0   Continue all meds Labs pending Health Maintenance reviewed Diet and exercise encouraged RTO 2 weeks   Saleah Rishel A. Benjamin Stain PA-C

## 2015-03-18 NOTE — Telephone Encounter (Signed)
Calling back to let us know which antibiotic she can take.  This was taken care of in the office visit note.

## 2015-03-20 LAB — UPPER RESPIRATORY CULTURE, ROUTINE

## 2015-03-23 ENCOUNTER — Telehealth: Payer: Self-pay | Admitting: Physician Assistant

## 2015-03-23 NOTE — Telephone Encounter (Signed)
Family aware that throat culture is negative.

## 2015-03-25 DIAGNOSIS — D649 Anemia, unspecified: Secondary | ICD-10-CM | POA: Diagnosis not present

## 2015-03-25 DIAGNOSIS — C8510 Unspecified B-cell lymphoma, unspecified site: Secondary | ICD-10-CM | POA: Diagnosis not present

## 2015-03-25 DIAGNOSIS — C8200 Follicular lymphoma grade I, unspecified site: Secondary | ICD-10-CM | POA: Diagnosis not present

## 2015-03-25 DIAGNOSIS — C8590 Non-Hodgkin lymphoma, unspecified, unspecified site: Secondary | ICD-10-CM | POA: Diagnosis not present

## 2015-03-25 DIAGNOSIS — C8580 Other specified types of non-Hodgkin lymphoma, unspecified site: Secondary | ICD-10-CM | POA: Diagnosis not present

## 2015-03-26 ENCOUNTER — Ambulatory Visit: Payer: Medicare Other | Admitting: Physician Assistant

## 2015-04-02 ENCOUNTER — Ambulatory Visit (HOSPITAL_COMMUNITY)
Admission: RE | Admit: 2015-04-02 | Discharge: 2015-04-02 | Disposition: A | Discharge status: Home / Self Care | Payer: Medicare Other | Source: Ambulatory Visit | Attending: Physician Assistant | Admitting: Physician Assistant

## 2015-04-02 ENCOUNTER — Other Ambulatory Visit: Payer: Self-pay

## 2015-04-02 ENCOUNTER — Encounter: Payer: Self-pay | Admitting: Physician Assistant

## 2015-04-02 ENCOUNTER — Ambulatory Visit (INDEPENDENT_AMBULATORY_CARE_PROVIDER_SITE_OTHER): Payer: Medicare Other | Admitting: Physician Assistant

## 2015-04-02 ENCOUNTER — Other Ambulatory Visit (HOSPITAL_COMMUNITY)
Admission: RE | Admit: 2015-04-02 | Discharge: 2015-04-02 | Disposition: A | Discharge status: Home / Self Care | Payer: Medicare Other | Source: Ambulatory Visit | Attending: Physician Assistant | Admitting: Physician Assistant

## 2015-04-02 ENCOUNTER — Other Ambulatory Visit: Payer: Self-pay | Admitting: Physician Assistant

## 2015-04-02 ENCOUNTER — Encounter (HOSPITAL_COMMUNITY): Payer: Self-pay | Admitting: *Deleted

## 2015-04-02 ENCOUNTER — Ambulatory Visit (INDEPENDENT_AMBULATORY_CARE_PROVIDER_SITE_OTHER): Payer: Medicare Other

## 2015-04-02 ENCOUNTER — Inpatient Hospital Stay (HOSPITAL_COMMUNITY)
Admission: EM | Admit: 2015-04-02 | Discharge: 2015-04-07 | DRG: 194 | Disposition: A | Discharge status: Home Health | Payer: Medicare Other | Attending: Internal Medicine | Admitting: Internal Medicine

## 2015-04-02 VITALS — BP 120/72 | HR 131 | Ht 63.0 in

## 2015-04-02 DIAGNOSIS — Z853 Personal history of malignant neoplasm of breast: Secondary | ICD-10-CM

## 2015-04-02 DIAGNOSIS — R0602 Shortness of breath: Secondary | ICD-10-CM

## 2015-04-02 DIAGNOSIS — Z9221 Personal history of antineoplastic chemotherapy: Secondary | ICD-10-CM

## 2015-04-02 DIAGNOSIS — I10 Essential (primary) hypertension: Secondary | ICD-10-CM | POA: Diagnosis present

## 2015-04-02 DIAGNOSIS — Z8701 Personal history of pneumonia (recurrent): Secondary | ICD-10-CM

## 2015-04-02 DIAGNOSIS — R918 Other nonspecific abnormal finding of lung field: Secondary | ICD-10-CM | POA: Insufficient documentation

## 2015-04-02 DIAGNOSIS — Z85841 Personal history of malignant neoplasm of brain: Secondary | ICD-10-CM | POA: Diagnosis not present

## 2015-04-02 DIAGNOSIS — Z833 Family history of diabetes mellitus: Secondary | ICD-10-CM | POA: Diagnosis not present

## 2015-04-02 DIAGNOSIS — Z7952 Long term (current) use of systemic steroids: Secondary | ICD-10-CM

## 2015-04-02 DIAGNOSIS — E039 Hypothyroidism, unspecified: Secondary | ICD-10-CM | POA: Diagnosis present

## 2015-04-02 DIAGNOSIS — D649 Anemia, unspecified: Secondary | ICD-10-CM | POA: Diagnosis not present

## 2015-04-02 DIAGNOSIS — M810 Age-related osteoporosis without current pathological fracture: Secondary | ICD-10-CM | POA: Diagnosis not present

## 2015-04-02 DIAGNOSIS — D709 Neutropenia, unspecified: Secondary | ICD-10-CM | POA: Diagnosis not present

## 2015-04-02 DIAGNOSIS — D72819 Decreased white blood cell count, unspecified: Secondary | ICD-10-CM | POA: Diagnosis not present

## 2015-04-02 DIAGNOSIS — C8590 Non-Hodgkin lymphoma, unspecified, unspecified site: Secondary | ICD-10-CM | POA: Diagnosis not present

## 2015-04-02 DIAGNOSIS — Z91041 Radiographic dye allergy status: Secondary | ICD-10-CM | POA: Diagnosis not present

## 2015-04-02 DIAGNOSIS — J189 Pneumonia, unspecified organism: Principal | ICD-10-CM | POA: Diagnosis present

## 2015-04-02 DIAGNOSIS — Z8249 Family history of ischemic heart disease and other diseases of the circulatory system: Secondary | ICD-10-CM | POA: Diagnosis not present

## 2015-04-02 DIAGNOSIS — E785 Hyperlipidemia, unspecified: Secondary | ICD-10-CM | POA: Diagnosis not present

## 2015-04-02 DIAGNOSIS — D708 Other neutropenia: Secondary | ICD-10-CM | POA: Diagnosis not present

## 2015-04-02 DIAGNOSIS — M3500 Sicca syndrome, unspecified: Secondary | ICD-10-CM | POA: Diagnosis present

## 2015-04-02 DIAGNOSIS — Y95 Nosocomial condition: Secondary | ICD-10-CM | POA: Diagnosis present

## 2015-04-02 LAB — CBC WITH DIFFERENTIAL/PLATELET
BASOS ABS: 0 10*3/uL (ref 0.0–0.1)
Basophils Relative: 7 %
EOS ABS: 0 10*3/uL (ref 0.0–0.7)
EOS PCT: 0 %
HCT: 32.3 % — ABNORMAL LOW (ref 36.0–46.0)
Hemoglobin: 10.4 g/dL — ABNORMAL LOW (ref 12.0–15.0)
Lymphocytes Relative: 59 %
Lymphs Abs: 0.4 10*3/uL — ABNORMAL LOW (ref 0.7–4.0)
MCH: 31.3 pg (ref 26.0–34.0)
MCHC: 32.2 g/dL (ref 30.0–36.0)
MCV: 97.3 fL (ref 78.0–100.0)
Monocytes Absolute: 0.2 10*3/uL (ref 0.1–1.0)
Monocytes Relative: 31 %
Neutro Abs: 0 10*3/uL — ABNORMAL LOW (ref 1.7–7.7)
Neutrophils Relative %: 3 %
PLATELETS: 175 10*3/uL (ref 150–400)
RBC: 3.32 MIL/uL — ABNORMAL LOW (ref 3.87–5.11)
RDW: 15.6 % — ABNORMAL HIGH (ref 11.5–15.5)
WBC: 0.6 10*3/uL — AB (ref 4.0–10.5)

## 2015-04-02 LAB — BASIC METABOLIC PANEL
Anion gap: 8 (ref 5–15)
BUN: 20 mg/dL (ref 6–20)
CHLORIDE: 98 mmol/L — AB (ref 101–111)
CO2: 26 mmol/L (ref 22–32)
CREATININE: 0.93 mg/dL (ref 0.44–1.00)
Calcium: 8.4 mg/dL — ABNORMAL LOW (ref 8.9–10.3)
GFR calc Af Amer: >60 mL/min (ref >60)
GFR, EST NON AFRICAN AMERICAN: 59 mL/min — AB (ref >60)
Glucose, Bld: 122 mg/dL — ABNORMAL HIGH (ref 65–99)
Potassium: 4.2 mmol/L (ref 3.5–5.1)
SODIUM: 132 mmol/L — AB (ref 135–145)

## 2015-04-02 LAB — D-DIMER, QUANTITATIVE (NOT AT ARMC): D DIMER QUANT: 1.44 ug{FEU}/mL — AB (ref 0.00–0.48)

## 2015-04-02 LAB — TROPONIN I: TROPONIN I: 0.1 ng/mL — AB (ref <0.031)

## 2015-04-02 MED ORDER — VANCOMYCIN HCL 500 MG IV SOLR
500.0000 mg | Freq: Two times a day (BID) | INTRAVENOUS | Status: DC
  Administered 2015-04-03 – 2015-04-06 (×8): 500 mg via INTRAVENOUS
  Filled 2015-04-02 (×11): qty 500

## 2015-04-02 MED ORDER — ENOXAPARIN SODIUM 60 MG/0.6ML ~~LOC~~ SOLN
1.0000 mg/kg | Freq: Once | SUBCUTANEOUS | Status: AC
  Administered 2015-04-02: 50 mg via SUBCUTANEOUS
  Filled 2015-04-02: qty 0.6

## 2015-04-02 MED ORDER — DEXTROSE 5 % IV SOLN
2.0000 g | Freq: Three times a day (TID) | INTRAVENOUS | Status: DC
  Administered 2015-04-02: 2 g via INTRAVENOUS
  Filled 2015-04-02 (×3): qty 2

## 2015-04-02 MED ORDER — VANCOMYCIN HCL IN DEXTROSE 1-5 GM/200ML-% IV SOLN
1000.0000 mg | Freq: Once | INTRAVENOUS | Status: AC
  Administered 2015-04-02: 1000 mg via INTRAVENOUS
  Filled 2015-04-02: qty 200

## 2015-04-02 MED ORDER — ASPIRIN 81 MG PO CHEW
324.0000 mg | CHEWABLE_TABLET | Freq: Once | ORAL | Status: AC
  Administered 2015-04-02: 324 mg via ORAL
  Filled 2015-04-02: qty 4

## 2015-04-02 MED ORDER — DEXTROSE 5 % IV SOLN
1.0000 g | Freq: Three times a day (TID) | INTRAVENOUS | Status: DC
  Administered 2015-04-03: 1 g via INTRAVENOUS
  Filled 2015-04-02 (×11): qty 1

## 2015-04-02 MED ORDER — SODIUM CHLORIDE 0.9 % IV BOLUS (SEPSIS)
500.0000 mL | Freq: Once | INTRAVENOUS | Status: AC
  Administered 2015-04-02: 500 mL via INTRAVENOUS

## 2015-04-02 NOTE — ED Notes (Addendum)
Pt had CT today.  Per family, pt was sent by [facility] to ED to be admitted due to abnormal labs and pneumonia.

## 2015-04-02 NOTE — ED Provider Notes (Signed)
CSN: [id number]     Arrival date & time 04/02/15  1958 History  By signing my name below, I, Eustaquio Maize, attest that this documentation has been prepared under the direction and in the presence of Sharlett Iles, MD. Electronically Signed: Eustaquio Maize, ED Scribe. 04/02/2015. 9:36 PM.  Chief Complaint  Patient presents with  . Shortness of Breath   The history is provided by the patient. No language interpreter was used.     HPI Comments: Wendy Kelley is a 74 y.o. female with hx lymphoma who presents to the Emergency Department complaining of shortness of breath x "awhile", possibly several months, worsening for the past 2 weeks. The shortness of breath is exacerbated with exertion and laying flat. Pt also complains of a mild cough and diarrhea yesterday. Pt was seen by her PCP, Marline Backbone, 2 weeks ago and was diagnosed with pneumonia. She was prescribed Z pack at that time without relief. She was seen again today for follow up and sent to the ED for abnormal lab values. Family states that pt was complaining of chest pain earlier today and while in the waiting room tonight. Pt has been eating and drinking normally. Denies chest pain, fever, nausea, vomiting, abdominal pain, hematochezia, or any other associated symptoms. Pt has been unable to have chemotherapy for the past 3 months due to low WBC count.   Past Medical History  Diagnosis Date  . Sjogrens syndrome   . Symptomatic menopausal or female climacteric states   . Osteoporosis   . Hypothyroidism   . Hyperlipidemia   . Hypertension   . Breast cancer     H/O chemotherapy  . Brain cancer   . Lymphoma   . Lymphoma    Past Surgical History  Procedure Laterality Date  . Breast surgery    . Tonsillectomy  1975  . Cholecystectomy, laparoscopic  2006  . Tubal ligation     Family History  Problem Relation Age of Onset  . Hypertension Mother   . Diabetes type II Mother   . Heart attack Mother     Died at age 72 of  MI  . Heart disease Mother   . Diabetes Mother   . Hypothyroidism Sister    Social History  Substance Use Topics  . Smoking status: Never Smoker   . Smokeless tobacco: Never Used  . Alcohol Use: No   OB History    No data available     Review of Systems  A complete 10 system review of systems was obtained and all systems are negative except as noted in the HPI and PMH.   Allergies  Adhesive; Iodinated diagnostic agents; and Iodine  Home Medications   Prior to Admission medications   Medication Sig Start Date End Date Taking? Authorizing Provider  fenofibrate micronized (LOFIBRA) 134 MG capsule TAKE ONE CAPSULE BY MOUTH ONE TIME DAILY 12/17/12  Yes Historical Provider, MD  ferrous sulfate 325 (65 FE) MG tablet Take 325 mg by mouth daily with breakfast.  06/25/13  Yes Historical Provider, MD  hydroxychloroquine (PLAQUENIL) 200 MG tablet Take 200 mg by mouth 2 (two) times daily.  06/25/13  Yes Historical Provider, MD  levofloxacin (LEVAQUIN) 500 MG tablet TAKE 1 TABLET (500 MG TOTAL)    BY MOUTH DAILY WHILE NEUTROPENIC. 12/04/14  Yes Historical Provider, MD  levothyroxine (SYNTHROID, LEVOTHROID) 50 MCG tablet Take 50 mcg by mouth daily.     Yes Historical Provider, MD  magnesium oxide (MAG-OX) 400 MG tablet Take  400 mg by mouth daily.   Yes Historical Provider, MD  megestrol (MEGACE) 40 MG/ML suspension Take 400 mg by mouth daily.  10/08/14  Yes Historical Provider, MD  nitrofurantoin, macrocrystal-monohydrate, (MACROBID) 100 MG capsule Take 1 capsule (100 mg total) by mouth 2 (two) times daily. Patient taking differently: Take 100 mg by mouth at bedtime. continuous 01/15/15  Yes Tiffany A Gann, PA-C  omeprazole (PRILOSEC) 20 MG capsule TAKE ONE CAPSULE BY MOUTH ONE TIME DAILY 12/29/14  Yes Historical Provider, MD  PARoxetine (PAXIL) 20 MG tablet TAKE  ONE TABLET BY MOUTH EVERY MORNING 12/25/14  Yes Historical Provider, MD  potassium chloride (K-DUR) 10 MEQ tablet Take 20 mEq by mouth  daily.   Yes Historical Provider, MD  predniSONE (DELTASONE) 5 MG tablet Take 5 mg by mouth daily. 02/23/15  Yes Historical Provider, MD  simvastatin (ZOCOR) 20 MG tablet Take 20 mg by mouth at bedtime.    Yes Historical Provider, MD  amoxicillin (AMOXIL) 500 MG capsule Take 500 mg by mouth as directed. Take four ills one hour before dental work and dental cleaning as long as catheter is in place    Historical Provider, MD  cyanocobalamin (,VITAMIN B-12,) 1000 MCG/ML injection Inject 1,000 mcg into the muscle every 30 (thirty) days.  02/25/15   Historical Provider, MD  diphenhydrAMINE (BENADRYL) 25 mg capsule Take two pills one hour prior to CT scans 07/16/13   Historical Provider, MD  ketoconazole (NIZORAL) 2 % cream Apply 1 application topically 2 (two) times daily. Patient not taking: Reported on 04/02/2015 01/29/15   Tiffany A Gann, PA-C  mupirocin ointment (BACTROBAN) 2 % Apply to AA of arm BID x 14 days Patient not taking: Reported on 04/02/2015 01/29/15   Tiffany A Gann, PA-C  sulfamethoxazole-trimethoprim (BACTRIM DS,SEPTRA DS) 800-160 MG per tablet Take 1 tablet by mouth 2 (two) times daily. Patient not taking: Reported on 04/02/2015 01/10/15   Adella Nissen, PA-C   Triage Vitals: BP 116/83 mmHg  Pulse 119  Temp(Src) 99.5 F (37.5 C) (Oral)  Resp 16  SpO2 100%   Physical Exam  Constitutional: She is oriented to person, place, and time. No distress.  Frail elderly woman  Mild dyspneic but no acute distress  HENT:  Head: Normocephalic and atraumatic.  Dry mucous membranes  Eyes: Conjunctivae are normal. Pupils are equal, round, and reactive to light.  Neck: Neck supple. No tracheal deviation present.  Cardiovascular: Regular rhythm, normal heart sounds and intact distal pulses.  Tachycardia present.   No murmur heard. Pulmonary/Chest: Effort normal. No respiratory distress. She has rales.  Mild dyspnea  Crackles bilaterally, right greater than left No respiratory distress  Abdominal:  Soft. Bowel sounds are normal. She exhibits no distension. There is no tenderness.  Musculoskeletal: Normal range of motion. She exhibits no edema.  Neurological: She is alert and oriented to person, place, and time.  Skin: Skin is warm and dry.  Psychiatric: She has a normal mood and affect. Her behavior is normal.  Nursing note and vitals reviewed.   ED Course  Procedures (including critical care time)  DIAGNOSTIC STUDIES: Oxygen Saturation is 100% on RA, normal by my interpretation.    COORDINATION OF CARE: 9:34 PM-Discussed treatment plan which includes IV antibiotics, IV fluids, and admission to the hospital with pt at bedside and pt agreed to plan.   Labs Review Labs Reviewed  CBC WITH DIFFERENTIAL/PLATELET - Abnormal; Notable for the following:    WBC 0.6 (*)    RBC 3.32 (*)  Hemoglobin 10.4 (*)    HCT 32.3 (*)    RDW 15.6 (*)    Neutro Abs 0.0 (*)    Lymphs Abs 0.4 (*)    All other components within normal limits  BASIC METABOLIC PANEL - Abnormal; Notable for the following:    Sodium 132 (*)    Chloride 98 (*)    Glucose, Bld 122 (*)    Calcium 8.4 (*)    GFR calc non Af Amer 59 (*)    All other components within normal limits  TROPONIN I - Abnormal; Notable for the following:    Troponin I 0.10 (*)    All other components within normal limits    Imaging Review Dg Chest 2 View  04/02/2015   CLINICAL DATA:  Shortness of breath.  Follow up pulmonary nodule.  EXAM: CHEST  2 VIEW  COMPARISON:  Radiographs 03/18/2015 and 05/19/2014.  CT 12/20/2007.  FINDINGS: Left IJ Port-A-Cath tip is unchanged at the SVC right atrial junction. The heart size and mediastinal contours are stable. There is a persistent right upper lobe nodular density overlapping the posterior aspect of the right fifth rib on the frontal examination. This is ill-defined, although potentially slightly larger than on the most recent study. No confluent airspace opacity or other nodules identified. There is  no pleural effusion. The bones appear unchanged.  IMPRESSION: Persistent and slightly larger nodular density in the right lung. This may reflect focal inflammation or a developing neoplasm. CT recommended for further evaluation.   Electronically Signed   By: Richardean Sale M.D.   On: 04/02/2015 16:24   Ct Chest Wo Contrast  04/02/2015   CLINICAL DATA:  Shortness of breath, undergoing chemotherapy for lymphoma  EXAM: CT CHEST WITHOUT CONTRAST  TECHNIQUE: Multidetector CT imaging of the chest was performed following the standard protocol without IV contrast.  COMPARISON:  Chest radiograph same date, most recent chest CT 12/20/2007  FINDINGS: Mediastinum/Nodes: Left-sided Port-A-Cath in place with tip at the distal SVC. Heart size normal. Trace pericardial fluid.  Evidence of right mastectomy. No masses identified in the right mastectomy bed.  Mediastinal lymphadenopathy noted with representative nodes as follows:  Pretracheal node 1 cm, image 16.  Conglomerate precarinal nodes, 1.3 cm image 24.  Retrocrural 0.9 cm node, image 54.  Lungs/Pleura: Right apical subpleural probable scarring image 12. Multiple bilateral diffuse areas of irregular pulmonary airspace opacities are identified, some of which demonstrate internal air bronchogram formation. The largest of these in the right upper lobe measures image 22.  Central airways are patent.  Upper abdomen: Hepatic presumed granulomatous calcifications are reidentified but not well visualized due to motion. Cholecystectomy clips are noted. Gastrohepatic lymphadenopathy, 0.6 cm image 55.  Musculoskeletal: No acute osseous abnormality. Lytic osseous lesions most prominent in the thoracic spine are again noted and not significantly changed.  IMPRESSION: Multi lobar diffuse ill-defined irregular patchy airspace opacities, with air bronchogram formation. Differential considerations include infection/inflammation, pneumonitis/treatment effect, and metastatic disease. These  are new and increased since the prior exam of 2009.  Multi station lymphadenopathy, compatible with lymphoma. This is not significantly changed.  Lytic osseous presumed metastatic disease again noted. This is also not significantly changed since the 2009 prior exam.   Electronically Signed   By: Conchita Paris M.D.   On: 04/02/2015 17:41   I have personally reviewed and evaluated these lab results as part of my medical decision-making.   EKG Interpretation None      MDM   Final diagnoses:  Pneumonia, organism unspecified  Neutropenia   74 year old female with lymphoma who presents with several weeks of worsening shortness of breath without any associated fever or significant cough. The patient's chemotherapy has been held for the past several months due to persistent low WBC count. On arrival, the patient was mildly dyspneic but in no acute distress. She was tachycardic but O2 sat 98-100% on room air. Crackles noted bilaterally but no respiratory distress on exam. EKG on arrival showed sinus tachycardia with no ischemic changes. CT had been obtained from PCP which showed multi lobar diffuse ill-defined patchy opacities, suggestive of inflammation versus treatment effect versus metastatic disease. Unable to obtain CTA due to the patient's contrast allergy. Obtained above labs which were notable for WBC 0.6, stable anemia with hemoglobin 10.4, troponin 0.1. I suspect that the patient's elevated troponin may be due to demand ischemia from her ongoing tachycardia and increased metabolic demands. Gave patient 324 mg aspirin and broad-spectrum antibiotics including vancomycin and cefepime because of the patient's underlying neutropenia. She remains afebrile and stable on room air. It is possible that her symptoms are due to worsening lymphoma. Discussed pt with hospitalist and patient will be admitted to general medicine for further care.  I personally performed the services described in this  documentation, which was scribed in my presence. The recorded information has been reviewed and is accurate.    Sharlett Iles, MD 04/03/15 [phone]

## 2015-04-02 NOTE — ED Notes (Signed)
CRITICAL VALUE ALERT  Critical value received:  WBC 0.6 Date of notification:  04/02/15 Time of notification:  2150 Critical value read back:YES Nurse who received alert: Audry Pili RN  MD notified (1st page):  Little Time of first page:  2150 MD notified (2nd page): Time of second page: Responding MD:  Little Time MD responded:  2150

## 2015-04-02 NOTE — ED Notes (Signed)
Pt placed on Neutropenic Precautions.

## 2015-04-02 NOTE — Progress Notes (Signed)
ANTIBIOTIC CONSULT NOTE - INITIAL  Pharmacy Consult for vancomycin and fortaz Indication: pneumonia  Allergies  Allergen Reactions  . Adhesive [Tape]   . Iodinated Diagnostic Agents Hives  . Iodine Rash    Patient Measurements:    Body Weight: 54 kg  Vital Signs: Temp: 99.5 F (37.5 C) (09/29 2003) Temp Source: Oral (09/29 2003) BP: 121/61 mmHg (09/29 2130) Pulse Rate: 107 (09/29 2145) Intake/Output from previous day:   Intake/Output from this shift:    Labs:  Recent Labs  04/02/15 2117  WBC 0.6*  HGB 10.4*  PLT 175  CREATININE 0.93   CrCl cannot be calculated (Unknown ideal weight.). No results for input(s): VANCOTROUGH, VANCOPEAK, VANCORANDOM, GENTTROUGH, GENTPEAK, GENTRANDOM, TOBRATROUGH, TOBRAPEAK, TOBRARND, AMIKACINPEAK, AMIKACINTROU, AMIKACIN in the last 72 hours.   Microbiology: Recent Results (from the past 720 hour(s))  Upper Respiratory Culture, Routine     Status: None   Collection Time: 03/18/15  1:17 PM  Result Value Ref Range Status   Upper Respiratory Culture Final report  Final   Result 1 Routine flora  Final    Comment: Heavy growth    Medical History: Past Medical History  Diagnosis Date  . Sjogrens syndrome   . Symptomatic menopausal or female climacteric states   . Osteoporosis   . Hypothyroidism   . Hyperlipidemia   . Hypertension   . Breast cancer     H/O chemotherapy  . Brain cancer   . Lymphoma   . Lymphoma     Medications:  See medication history Assessment: 74 yo lady to start antibiotics for HCAP.  Initial doses ordered in the ED  Goal of Therapy:  Vancomycin trough level 15-20 mcg/ml  Plan:  Vancomycin 500 mg IV q12 hours Fortaz 1 gm IV q8 hours Monitor renal function, cultures and clinical course Thanks for allowing pharmacy to be a part of this patient's care.  Excell Seltzer, PharmD Clinical Pharmacist  04/02/2015,10:08 PM

## 2015-04-02 NOTE — Progress Notes (Signed)
   Subjective:    Patient ID: Wendy Kelley, female    DOB: [birth year], 74 y.o.   MRN: [id number]  HPI 74 y/o female with numerous co-morbidities, undergoing chemo for lymphoma presents for follow up of recent treatment of pneumonia. She has not been able to take chemo in 3 months d/t low WBC. She complains of increased SOB today.     Review of Systems  Respiratory: Positive for shortness of breath.   Gastrointestinal: Positive for diarrhea.  Genitourinary: Positive for urgency.  Neurological: Positive for weakness.       Objective:   Physical Exam  Constitutional:  Malnourished   Cardiovascular:  Tachycardic at 131   Pulmonary/Chest: No respiratory distress. She has wheezes. She exhibits no tenderness.  tachypneic   Nursing note and vitals reviewed.  Xray demonstrates Persistent and slightly larger nodular density in the right lung.This may reflect focal inflammation or a developing neoplasm. CT was recommended for further evaluation.        Assessment & Plan:  1. SOB (shortness of breath)  - DG Chest 2 View - CT Chest Wo Contrast; Future  2. History of pneumonia  - DG Chest 2 View - CT Chest Wo Contrast; Future   Will report CT results once received and determine treatment plan at that time.   Tiffany A. Benjamin Stain PA-C

## 2015-04-03 ENCOUNTER — Encounter (HOSPITAL_COMMUNITY): Payer: Self-pay

## 2015-04-03 ENCOUNTER — Inpatient Hospital Stay (HOSPITAL_COMMUNITY): Payer: Medicare Other

## 2015-04-03 DIAGNOSIS — R0602 Shortness of breath: Secondary | ICD-10-CM | POA: Diagnosis present

## 2015-04-03 DIAGNOSIS — D709 Neutropenia, unspecified: Secondary | ICD-10-CM | POA: Diagnosis present

## 2015-04-03 MED ORDER — SODIUM CHLORIDE 0.9 % IJ SOLN
3.0000 mL | Freq: Two times a day (BID) | INTRAMUSCULAR | Status: DC
  Administered 2015-04-03 – 2015-04-06 (×5): 3 mL via INTRAVENOUS

## 2015-04-03 MED ORDER — SODIUM CHLORIDE 0.9 % IJ SOLN
3.0000 mL | Freq: Two times a day (BID) | INTRAMUSCULAR | Status: DC
  Administered 2015-04-04: 3 mL via INTRAVENOUS

## 2015-04-03 MED ORDER — DEXTROSE 5 % IV SOLN
INTRAVENOUS | Status: AC
  Filled 2015-04-03: qty 1

## 2015-04-03 MED ORDER — SODIUM CHLORIDE 0.9 % IJ SOLN: 3.0000 mL | INTRAMUSCULAR | Status: DC | PRN

## 2015-04-03 MED ORDER — DEXTROSE 5 % IV SOLN
1.0000 g | Freq: Three times a day (TID) | INTRAVENOUS | Status: DC
  Administered 2015-04-03 – 2015-04-07 (×11): 1 g via INTRAVENOUS
  Filled 2015-04-03 (×15): qty 1

## 2015-04-03 MED ORDER — ENSURE ENLIVE PO LIQD
237.0000 mL | Freq: Two times a day (BID) | ORAL | Status: DC
  Administered 2015-04-03 – 2015-04-07 (×6): 237 mL via ORAL

## 2015-04-03 MED ORDER — SODIUM CHLORIDE 0.9 % IV SOLN
250.0000 mL | INTRAVENOUS | Status: DC | PRN
  Administered 2015-04-05: 250 mL via INTRAVENOUS

## 2015-04-03 MED ORDER — PREDNISONE 10 MG PO TABS
5.0000 mg | ORAL_TABLET | Freq: Every day | ORAL | Status: DC
  Administered 2015-04-03: 5 mg via ORAL
  Filled 2015-04-03 (×2): qty 1

## 2015-04-03 MED ORDER — TECHNETIUM TO 99M ALBUMIN AGGREGATED
4.0000 | Freq: Once | INTRAVENOUS | Status: AC | PRN
  Administered 2015-04-03: 3.8 via INTRAVENOUS

## 2015-04-03 MED ORDER — TECHNETIUM TC 99M DIETHYLENETRIAME-PENTAACETIC ACID
30.0000 | Freq: Once | INTRAVENOUS | Status: DC | PRN
  Administered 2015-04-03: 33 via INTRAVENOUS
  Filled 2015-04-03: qty 30

## 2015-04-03 NOTE — Care Management Important Message (Signed)
Important Message  Patient Details  Name: Wendy Kelley MRN: [id number] Date of Birth: [birth year]   Medicare Important Message Given:  Yes-second notification given    Sherald Barge, RN 04/03/2015, 10:00 AM

## 2015-04-03 NOTE — Care Management Note (Signed)
Case Management Note  Patient Details  Name: Wendy Kelley MRN: [id number] Date of Birth: [birth year]  Expected Discharge Date:   04/03/2015               Expected Discharge Plan:  Home/Self Care  In-House Referral:  NA  Discharge planning Services  CM Consult  Post Acute Care Choice:  NA Choice offered to:  NA  DME Arranged:    DME Agency:     HH Arranged:    [place] Agency:     Status of Service:  In process, will continue to follow  Medicare Important Message Given:    Date Medicare IM Given:    Medicare IM give by:    Date Additional Medicare IM Given:    Additional Medicare Important Message give by:     If discussed at [place] of Stay Meetings, dates discussed:    Additional Comments: Pt is from home, lives with husband and daughter. Pt mostly ind at baseline, uses a walker and wheelchair as needed. Pt has no HH services or DME needs prior to admission. Pt uses no O2 or resp DME prior to admission. Pt plans to return home with self care at DC. No CM needs anticipated. Will cont to follow.  Sherald Barge, RN 04/03/2015, 9:40 AM

## 2015-04-03 NOTE — H&P (Signed)
PCP:   Marline Backbone, PA-C   Chief Complaint:  sob  HPI: 74 yo female h/o lymphoma (unknown extent or staging) sent to hospital today for ct chest for 2 weeks of sob.  Pt went to see her PCP today, who was worried about a PE.  Ct chest was ordered however without contrast.  She has been treated with a zpack 2 weeks ago for possible pna.  Today she had follow up to see if her cxr was clearer.  Sent to ct.  After ct was obtained, was abnormal sent to the ED.  Pt denies any fevers at home.  No cough.  No swelling anywhere in legs.  No chest pain.  She has not been able to get her chemo for 3 months because of low wbc.  She has not idea where her lymphoma is or what stage.  She gets all her care at [facility].  Has been on chemo for over 4 years.  Pt referred for admission for abnormal ct scan possible hcap.  Pt has allergy to dye.  Review of Systems:  Positive and negative as per HPI otherwise all other systems are negative  Past Medical History: Past Medical History  Diagnosis Date  . Sjogrens syndrome   . Symptomatic menopausal or female climacteric states   . Osteoporosis   . Hypothyroidism   . Hyperlipidemia   . Hypertension   . Breast cancer     H/O chemotherapy  . Brain cancer   . Lymphoma   . Lymphoma    Past Surgical History  Procedure Laterality Date  . Breast surgery    . Tonsillectomy  1975  . Cholecystectomy, laparoscopic  2006  . Tubal ligation      Medications: Prior to Admission medications   Medication Sig Start Date End Date Taking? Authorizing Amberia Bayless  fenofibrate micronized (LOFIBRA) 134 MG capsule TAKE ONE CAPSULE BY MOUTH ONE TIME DAILY 12/17/12  Yes Historical Dima Mini, MD  ferrous sulfate 325 (65 FE) MG tablet Take 325 mg by mouth daily with breakfast.  06/25/13  Yes Historical Geo Slone, MD  hydroxychloroquine (PLAQUENIL) 200 MG tablet Take 200 mg by mouth 2 (two) times daily.  06/25/13  Yes Historical Caliegh Middlekauff, MD  levofloxacin (LEVAQUIN) 500 MG tablet  TAKE 1 TABLET (500 MG TOTAL)    BY MOUTH DAILY WHILE NEUTROPENIC. 12/04/14  Yes Historical Moriyah Byington, MD  levothyroxine (SYNTHROID, LEVOTHROID) 50 MCG tablet Take 50 mcg by mouth daily.     Yes Historical Harper Vandervoort, MD  magnesium oxide (MAG-OX) 400 MG tablet Take 400 mg by mouth daily.   Yes Historical Didi Ganaway, MD  megestrol (MEGACE) 40 MG/ML suspension Take 400 mg by mouth daily.  10/08/14  Yes Historical Tamarcus Condie, MD  nitrofurantoin, macrocrystal-monohydrate, (MACROBID) 100 MG capsule Take 1 capsule (100 mg total) by mouth 2 (two) times daily. Patient taking differently: Take 100 mg by mouth at bedtime. continuous 01/15/15  Yes Tiffany A Gann, PA-C  omeprazole (PRILOSEC) 20 MG capsule TAKE ONE CAPSULE BY MOUTH ONE TIME DAILY 12/29/14  Yes Historical Jake Goodson, MD  PARoxetine (PAXIL) 20 MG tablet TAKE  ONE TABLET BY MOUTH EVERY MORNING 12/25/14  Yes Historical Lasya Vetter, MD  potassium chloride (K-DUR) 10 MEQ tablet Take 20 mEq by mouth daily.   Yes Historical Jaylon Grode, MD  predniSONE (DELTASONE) 5 MG tablet Take 5 mg by mouth daily. 02/23/15  Yes Historical Trinitee Horgan, MD  simvastatin (ZOCOR) 20 MG tablet Take 20 mg by mouth at bedtime.    Yes Historical Abundio Teuscher, MD  amoxicillin (AMOXIL) 500 MG capsule Take 500 mg by mouth as directed. Take four ills one hour before dental work and dental cleaning as long as catheter is in place    Historical Madeeha Costantino, MD  cyanocobalamin (,VITAMIN B-12,) 1000 MCG/ML injection Inject 1,000 mcg into the muscle every 30 (thirty) days.  02/25/15   Historical Geovanie Winnett, MD  diphenhydrAMINE (BENADRYL) 25 mg capsule Take two pills one hour prior to CT scans 07/16/13   Historical Neo Yepiz, MD  ketoconazole (NIZORAL) 2 % cream Apply 1 application topically 2 (two) times daily. Patient not taking: Reported on 04/02/2015 01/29/15   Tiffany A Gann, PA-C  mupirocin ointment (BACTROBAN) 2 % Apply to AA of arm BID x 14 days Patient not taking: Reported on 04/02/2015 01/29/15   Tiffany A Gann, PA-C   sulfamethoxazole-trimethoprim (BACTRIM DS,SEPTRA DS) 800-160 MG per tablet Take 1 tablet by mouth 2 (two) times daily. Patient not taking: Reported on 04/02/2015 01/10/15   Adella Nissen, PA-C    Allergies:   Allergies  Allergen Reactions  . Adhesive [Tape]   . Iodinated Diagnostic Agents Hives  . Iodine Rash    Social History:  reports that she has never smoked. She has never used smokeless tobacco. She reports that she does not drink alcohol or use illicit drugs.  Family History: Family History  Problem Relation Age of Onset  . Hypertension Mother   . Diabetes type II Mother   . Heart attack Mother     Died at age 25 of MI  . Heart disease Mother   . Diabetes Mother   . Hypothyroidism Sister     Physical Exam: Filed Vitals:   04/02/15 2315 04/02/15 2330 04/03/15 0014 04/03/15 0037  BP:  134/82 115/82 135/65  Pulse: 110  116 100  Temp:    98.4 F (36.9 C)  TempSrc:    Oral  Resp: 22 23 24 22   Height:    5\' 3"  (1.6 m)  Weight:    56.473 kg (124 lb 8 oz)  SpO2: 100%  100% 100%   General appearance: alert, cooperative and no distress Head: Normocephalic, without obvious abnormality, atraumatic Eyes: negative Nose: Nares normal. Septum midline. Mucosa normal. No drainage or sinus tenderness. Neck: no JVD and supple, symmetrical, trachea midline Lungs: clear to auscultation bilaterally Heart: regular rate and rhythm, S1, S2 normal, no murmur, click, rub or gallop Abdomen: soft, non-tender; bowel sounds normal; no masses,  no organomegaly Extremities: extremities normal, atraumatic, no cyanosis or edema Pulses: 2+ and symmetric Skin: Skin color, texture, turgor normal. No rashes or lesions Neurologic: Grossly normal    Labs on Admission:   Recent Labs  04/02/15 2117  NA 132*  K 4.2  CL 98*  CO2 26  GLUCOSE 122*  BUN 20  CREATININE 0.93  CALCIUM 8.4*      Recent Labs  04/02/15 2117  WBC 0.6*  NEUTROABS 0.0*  HGB 10.4*  HCT 32.3*  MCV 97.3   PLT 175    Recent Labs  04/02/15 2117  TROPONINI 0.10*    Radiological Exams on Admission: Dg Chest 2 View  04/02/2015   CLINICAL DATA:  Shortness of breath.  Follow up pulmonary nodule.  EXAM: CHEST  2 VIEW  COMPARISON:  Radiographs 03/18/2015 and 05/19/2014.  CT 12/20/2007.  FINDINGS: Left IJ Port-A-Cath tip is unchanged at the SVC right atrial junction. The heart size and mediastinal contours are stable. There is a persistent right upper lobe nodular density overlapping the posterior aspect of  the right fifth rib on the frontal examination. This is ill-defined, although potentially slightly larger than on the most recent study. No confluent airspace opacity or other nodules identified. There is no pleural effusion. The bones appear unchanged.  IMPRESSION: Persistent and slightly larger nodular density in the right lung. This may reflect focal inflammation or a developing neoplasm. CT recommended for further evaluation.   Electronically Signed   By: Richardean Sale M.D.   On: 04/02/2015 16:24    Ct Chest Wo Contrast  04/02/2015   CLINICAL DATA:  Shortness of breath, undergoing chemotherapy for lymphoma  EXAM: CT CHEST WITHOUT CONTRAST  TECHNIQUE: Multidetector CT imaging of the chest was performed following the standard protocol without IV contrast.  COMPARISON:  Chest radiograph same date, most recent chest CT 12/20/2007  FINDINGS: Mediastinum/Nodes: Left-sided Port-A-Cath in place with tip at the distal SVC. Heart size normal. Trace pericardial fluid.  Evidence of right mastectomy. No masses identified in the right mastectomy bed.  Mediastinal lymphadenopathy noted with representative nodes as follows:  Pretracheal node 1 cm, image 16.  Conglomerate precarinal nodes, 1.3 cm image 24.  Retrocrural 0.9 cm node, image 54.  Lungs/Pleura: Right apical subpleural probable scarring image 12. Multiple bilateral diffuse areas of irregular pulmonary airspace opacities are identified, some of which  demonstrate internal air bronchogram formation. The largest of these in the right upper lobe measures image 22.  Central airways are patent.  Upper abdomen: Hepatic presumed granulomatous calcifications are reidentified but not well visualized due to motion. Cholecystectomy clips are noted. Gastrohepatic lymphadenopathy, 0.6 cm image 55.  Musculoskeletal: No acute osseous abnormality. Lytic osseous lesions most prominent in the thoracic spine are again noted and not significantly changed.  IMPRESSION: Multi lobar diffuse ill-defined irregular patchy airspace opacities, with air bronchogram formation. Differential considerations include infection/inflammation, pneumonitis/treatment effect, and metastatic disease. These are new and increased since the prior exam of 2009.  Multi station lymphadenopathy, compatible with lymphoma. This is not significantly changed.  Lytic osseous presumed metastatic disease again noted. This is also not significantly changed since the 2009 prior exam.   Electronically Signed   By: Conchita Paris M.D.   On: 04/02/2015 17:41   ekg reviewed sinus tachy, no acute issues  Assessment/Plan  74 yo female h/o lymphoma with 2 weeks of sob  Principal Problem:   SOB (shortness of breath)-  Obtain vq scan in am, cover for possible PE with dose of lovenox.  Unknown if this is progression of her lymphoma.  Place on iv vanc/fortaz for now.  Obtain records from [facility].  Active Problems:   Lymphoma get old records   HCAP (healthcare-associated pneumonia)   Neutropenia  Admit to tele.  Full code.  DAVID,RACHAL A 04/03/2015, 1:17 AM

## 2015-04-03 NOTE — Progress Notes (Signed)
Patient admitted after midnight. Chart reviewed. Patient examined. Has had "cold" symptoms for the past week including weakness, shortness of breath. Slight cough. Reviewed records from [facility] via care everywhere.  Infiltrates are new. Severe neutropenia appears chronic. V/Q negative. Continue treatment for HCAP in the setting of severe neutropenia. Neutropenic precautions. [place] [address]. Continue inpatient status. Physical therapy evaluation. Patient uses a walker sometimes. Lives at home with elderly husband.  Doree Barthel, MD [facility]

## 2015-04-04 DIAGNOSIS — D708 Other neutropenia: Secondary | ICD-10-CM

## 2015-04-04 DIAGNOSIS — D649 Anemia, unspecified: Secondary | ICD-10-CM | POA: Diagnosis not present

## 2015-04-04 DIAGNOSIS — D72819 Decreased white blood cell count, unspecified: Secondary | ICD-10-CM | POA: Diagnosis not present

## 2015-04-04 DIAGNOSIS — J189 Pneumonia, unspecified organism: Principal | ICD-10-CM

## 2015-04-04 DIAGNOSIS — R0602 Shortness of breath: Secondary | ICD-10-CM

## 2015-04-04 LAB — CBC WITH DIFFERENTIAL/PLATELET
BASOS ABS: 0 10*3/uL (ref 0.0–0.1)
BASOS PCT: 6 %
EOS ABS: 0 10*3/uL (ref 0.0–0.7)
Eosinophils Relative: 0 %
HEMATOCRIT: 31.1 % — AB (ref 36.0–46.0)
Hemoglobin: 9.9 g/dL — ABNORMAL LOW (ref 12.0–15.0)
Lymphocytes Relative: 52 %
Lymphs Abs: 0.3 10*3/uL — ABNORMAL LOW (ref 0.7–4.0)
MCH: 31 pg (ref 26.0–34.0)
MCHC: 31.8 g/dL (ref 30.0–36.0)
MCV: 97.5 fL (ref 78.0–100.0)
MONO ABS: 0.2 10*3/uL (ref 0.1–1.0)
MONOS PCT: 37 %
NEUTROS PCT: 6 %
Neutro Abs: 0 10*3/uL — ABNORMAL LOW (ref 1.7–7.7)
Platelets: 161 10*3/uL (ref 150–400)
RBC: 3.19 MIL/uL — ABNORMAL LOW (ref 3.87–5.11)
RDW: 15.5 % (ref 11.5–15.5)
WBC: 0.5 10*3/uL — CL (ref 4.0–10.5)

## 2015-04-04 LAB — STREP PNEUMONIAE URINARY ANTIGEN: STREP PNEUMO URINARY ANTIGEN: NEGATIVE

## 2015-04-04 MED ORDER — PREDNISONE 10 MG PO TABS
5.0000 mg | ORAL_TABLET | Freq: Every day | ORAL | Status: DC
  Administered 2015-04-04 – 2015-04-07 (×4): 5 mg via ORAL
  Filled 2015-04-04 (×3): qty 1

## 2015-04-04 MED ORDER — ACETAMINOPHEN 325 MG PO TABS
650.0000 mg | ORAL_TABLET | Freq: Four times a day (QID) | ORAL | Status: DC | PRN
  Administered 2015-04-04 – 2015-04-06 (×2): 650 mg via ORAL
  Filled 2015-04-04 (×2): qty 2

## 2015-04-04 NOTE — Progress Notes (Signed)
[facility] PROGRESS NOTE  [name] HYQ:[id number] DOB: 1941-06-19    PCP:   Marline Backbone, PA-C   HPI: Wendy Kelley is an 74 y.o. female with hx of lymphoma, unclear staging, hx of chronic neutopenia, HTN, hypothyroidism, Sjogren's syndrome, admitted with HCAP and neutropenia.  She is not SOB, and is stable.  She was given IV Van/Zosyn, and her WBC is 0.5 K with 6 percent neutrophils, 37percent Mono, and 52 percent lymphocytes.  She has no complaints.   Rewiew of Systems:  Constitutional: Negative for malaise, fever and chills. No significant weight loss or weight gain Eyes: Negative for eye pain, redness and discharge, diplopia, visual changes, or flashes of light. ENMT: Negative for ear pain, hoarseness, nasal congestion, sinus pressure and sore throat. No headaches; tinnitus, drooling, or problem swallowing. Cardiovascular: Negative for chest pain, palpitations, diaphoresis, dyspnea and peripheral edema. ; No orthopnea, PND Respiratory: Negative for cough, hemoptysis, wheezing and stridor. No pleuritic chestpain. Gastrointestinal: Negative for nausea, vomiting, diarrhea, constipation, abdominal pain, melena, blood in stool, hematemesis, jaundice and rectal bleeding.    Genitourinary: Negative for frequency, dysuria, incontinence,flank pain and hematuria; Musculoskeletal: Negative for back pain and neck pain. Negative for swelling and trauma.;  Skin: . Negative for pruritus, rash, abrasions, bruising and skin lesion.; ulcerations Neuro: Negative for headache, lightheadedness and neck stiffness. Negative for weakness, altered level of consciousness , altered mental status, extremity weakness, burning feet, involuntary movement, seizure and syncope.  Psych: negative for anxiety, depression, insomnia, tearfulness, panic attacks, hallucinations, paranoia, suicidal or homicidal ideation   Past Medical History  Diagnosis Date  . Sjogrens syndrome   . Symptomatic menopausal or  female climacteric states   . Osteoporosis   . Hypothyroidism   . Hyperlipidemia   . Hypertension   . Breast cancer     H/O chemotherapy  . Brain cancer   . Lymphoma   . Lymphoma     Past Surgical History  Procedure Laterality Date  . Breast surgery    . Tonsillectomy  1975  . Cholecystectomy, laparoscopic  2006  . Tubal ligation      Medications:  HOME MEDS: Prior to Admission medications   Medication Sig Start Date End Date Taking? Authorizing Provider  fenofibrate micronized (LOFIBRA) 134 MG capsule TAKE ONE CAPSULE BY MOUTH ONE TIME DAILY 12/17/12  Yes Historical Provider, MD  ferrous sulfate 325 (65 FE) MG tablet Take 325 mg by mouth daily with breakfast.  06/25/13  Yes Historical Provider, MD  hydroxychloroquine (PLAQUENIL) 200 MG tablet Take 200 mg by mouth 2 (two) times daily.  06/25/13  Yes Historical Provider, MD  levofloxacin (LEVAQUIN) 500 MG tablet TAKE 1 TABLET (500 MG TOTAL)    BY MOUTH DAILY WHILE NEUTROPENIC. 12/04/14  Yes Historical Provider, MD  levothyroxine (SYNTHROID, LEVOTHROID) 50 MCG tablet Take 50 mcg by mouth daily.     Yes Historical Provider, MD  magnesium oxide (MAG-OX) 400 MG tablet Take 400 mg by mouth daily.   Yes Historical Provider, MD  megestrol (MEGACE) 40 MG/ML suspension Take 400 mg by mouth daily.  10/08/14  Yes Historical Provider, MD  nitrofurantoin, macrocrystal-monohydrate, (MACROBID) 100 MG capsule Take 1 capsule (100 mg total) by mouth 2 (two) times daily. Patient taking differently: Take 100 mg by mouth at bedtime. continuous 01/15/15  Yes Tiffany A Gann, PA-C  omeprazole (PRILOSEC) 20 MG capsule TAKE ONE CAPSULE BY MOUTH ONE TIME DAILY 12/29/14  Yes Historical Provider, MD  PARoxetine (PAXIL) 20 MG tablet TAKE  ONE  TABLET BY MOUTH EVERY MORNING 12/25/14  Yes Historical Provider, MD  potassium chloride (K-DUR) 10 MEQ tablet Take 20 mEq by mouth daily.   Yes Historical Provider, MD  predniSONE (DELTASONE) 5 MG tablet Take 5 mg by mouth daily.  02/23/15  Yes Historical Provider, MD  simvastatin (ZOCOR) 20 MG tablet Take 20 mg by mouth at bedtime.    Yes Historical Provider, MD  amoxicillin (AMOXIL) 500 MG capsule Take 500 mg by mouth as directed. Take four ills one hour before dental work and dental cleaning as long as catheter is in place    Historical Provider, MD  cyanocobalamin (,VITAMIN B-12,) 1000 MCG/ML injection Inject 1,000 mcg into the muscle every 30 (thirty) days.  02/25/15   Historical Provider, MD  diphenhydrAMINE (BENADRYL) 25 mg capsule Take two pills one hour prior to CT scans 07/16/13   Historical Provider, MD  ketoconazole (NIZORAL) 2 % cream Apply 1 application topically 2 (two) times daily. Patient not taking: Reported on 04/02/2015 01/29/15   Tiffany A Gann, PA-C  mupirocin ointment (BACTROBAN) 2 % Apply to AA of arm BID x 14 days Patient not taking: Reported on 04/02/2015 01/29/15   Tiffany A Gann, PA-C  sulfamethoxazole-trimethoprim (BACTRIM DS,SEPTRA DS) 800-160 MG per tablet Take 1 tablet by mouth 2 (two) times daily. Patient not taking: Reported on 04/02/2015 01/10/15   Adella Nissen, PA-C     Allergies:  Allergies  Allergen Reactions  . Adhesive [Tape]   . Iodinated Diagnostic Agents Hives  . Iodine Rash    Social History:   reports that she has never smoked. She has never used smokeless tobacco. She reports that she does not drink alcohol or use illicit drugs.  Family History: Family History  Problem Relation Age of Onset  . Hypertension Mother   . Diabetes type II Mother   . Heart attack Mother     Died at age 20 of MI  . Heart disease Mother   . Diabetes Mother   . Hypothyroidism Sister      Physical Exam: Filed Vitals:   04/03/15 1415 04/03/15 2056 04/04/15 0555 04/04/15 1420  BP: 134/72 125/72 133/62 125/69  Pulse: 118 109 119 123  Temp: 98.8 F (37.1 C) 98.2 F (36.8 C) 100 F (37.8 C) 98.8 F (37.1 C)  TempSrc: Oral Oral Oral Oral  Resp: 20 20 20 20   Height:      Weight:       SpO2: 99% 100% 98% 99%   Blood pressure 125/69, pulse 123, temperature 98.8 F (37.1 C), temperature source Oral, resp. rate 20, height 5\' 3"  (1.6 m), weight 56.473 kg (124 lb 8 oz), SpO2 99 %.  GEN:  Pleasant  patient lying in the stretcher in no acute distress; cooperative with exam. PSYCH:  alert and oriented x4; does not appear anxious or depressed; affect is appropriate. HEENT: Mucous membranes pink and anicteric; PERRLA; EOM intact; no cervical lymphadenopathy nor thyromegaly or carotid bruit; no JVD; There were no stridor. Neck is very supple. Breasts:: Not examined CHEST WALL: No tenderness CHEST: Normal respiration, clear to auscultation bilaterally.  HEART: Regular rate and rhythm.  There are no murmur, rub, or gallops.   BACK: No kyphosis or scoliosis; no CVA tenderness ABDOMEN: soft and non-tender; no masses, no organomegaly, normal abdominal bowel sounds; no pannus; no intertriginous candida. There is no rebound and no distention. Rectal Exam: Not done EXTREMITIES: No bone or joint deformity; age-appropriate arthropathy of the hands and knees; no edema; no  ulcerations.  There is no calf tenderness. Genitalia: not examined PULSES: 2+ and symmetric SKIN: Normal hydration no rash or ulceration CNS: Cranial nerves 2-12 grossly intact no focal lateralizing neurologic deficit.  Speech is fluent; uvula elevated with phonation, facial symmetry and tongue midline. DTR are normal bilaterally, cerebella exam is intact, barbinski is negative and strengths are equaled bilaterally.  No sensory loss.   Labs on Admission:  Basic Metabolic Panel:  Recent Labs Lab 04/02/15 2117  NA 132*  K 4.2  CL 98*  CO2 26  GLUCOSE 122*  BUN 20  CREATININE 0.93  CALCIUM 8.4*   CBC:  Recent Labs Lab 04/02/15 2117 04/04/15 0616  WBC 0.6* 0.5*  NEUTROABS 0.0* 0.0*  HGB 10.4* 9.9*  HCT 32.3* 31.1*  MCV 97.3 97.5  PLT 175 161   Cardiac Enzymes:  Recent Labs Lab 04/02/15 2117   TROPONINI 0.10*   Radiological Exams on Admission: Dg Chest 2 View  04/02/2015   CLINICAL DATA:  Shortness of breath.  Follow up pulmonary nodule.  EXAM: CHEST  2 VIEW  COMPARISON:  Radiographs 03/18/2015 and 05/19/2014.  CT 12/20/2007.  FINDINGS: Left IJ Port-A-Cath tip is unchanged at the SVC right atrial junction. The heart size and mediastinal contours are stable. There is a persistent right upper lobe nodular density overlapping the posterior aspect of the right fifth rib on the frontal examination. This is ill-defined, although potentially slightly larger than on the most recent study. No confluent airspace opacity or other nodules identified. There is no pleural effusion. The bones appear unchanged.  IMPRESSION: Persistent and slightly larger nodular density in the right lung. This may reflect focal inflammation or a developing neoplasm. CT recommended for further evaluation.   Electronically Signed   By: Richardean Sale M.D.   On: 04/02/2015 16:24   Ct Chest Wo Contrast  04/02/2015   CLINICAL DATA:  Shortness of breath, undergoing chemotherapy for lymphoma  EXAM: CT CHEST WITHOUT CONTRAST  TECHNIQUE: Multidetector CT imaging of the chest was performed following the standard protocol without IV contrast.  COMPARISON:  Chest radiograph same date, most recent chest CT 12/20/2007  FINDINGS: Mediastinum/Nodes: Left-sided Port-A-Cath in place with tip at the distal SVC. Heart size normal. Trace pericardial fluid.  Evidence of right mastectomy. No masses identified in the right mastectomy bed.  Mediastinal lymphadenopathy noted with representative nodes as follows:  Pretracheal node 1 cm, image 16.  Conglomerate precarinal nodes, 1.3 cm image 24.  Retrocrural 0.9 cm node, image 54.  Lungs/Pleura: Right apical subpleural probable scarring image 12. Multiple bilateral diffuse areas of irregular pulmonary airspace opacities are identified, some of which demonstrate internal air bronchogram formation. The  largest of these in the right upper lobe measures image 22.  Central airways are patent.  Upper abdomen: Hepatic presumed granulomatous calcifications are reidentified but not well visualized due to motion. Cholecystectomy clips are noted. Gastrohepatic lymphadenopathy, 0.6 cm image 55.  Musculoskeletal: No acute osseous abnormality. Lytic osseous lesions most prominent in the thoracic spine are again noted and not significantly changed.  IMPRESSION: Multi lobar diffuse ill-defined irregular patchy airspace opacities, with air bronchogram formation. Differential considerations include infection/inflammation, pneumonitis/treatment effect, and metastatic disease. These are new and increased since the prior exam of 2009.  Multi station lymphadenopathy, compatible with lymphoma. This is not significantly changed.  Lytic osseous presumed metastatic disease again noted. This is also not significantly changed since the 2009 prior exam.   Electronically Signed   By: Roena Malady.D.  On: 04/02/2015 17:41   Nm Pulmonary Perf And Vent  04/03/2015   CLINICAL DATA:  Long history a shortness of breath with symptoms worsening in past year particularly last month, breast cancer, lymphoma, Sjogrens  EXAM: NUCLEAR MEDICINE VENTILATION - PERFUSION LUNG SCAN  TECHNIQUE: Ventilation images were obtained in multiple projections using inhaled aerosol Tc-59m DTPA. Perfusion images were obtained in multiple projections after intravenous injection of Tc-75m MAA.  RADIOPHARMACEUTICALS:  33 mCi Technetium-52m DTPA aerosol inhalation and 3.8 mCi Technetium-42m MAA IV  COMPARISON:  None; correlation chest radiograph 04/02/2015, noncontrast CT chest 04/02/2015  FINDINGS: Ventilation: Small subsegmental ventilation defects are identified in the periphery of both lungs. Central airway deposition of aerosol.  Perfusion: Small peripheral subsegmental perfusion defects matching to ventilatory findings. No additional perfusion defects  identified.  Chest radiograph: Vague nodular density RIGHT upper lobe by chest radiography with chest CT demonstrating scattered ill-defined multi lobar patchy irregular airspace opacities.  IMPRESSION: Matching small peripheral subsegmental ventilation and perfusion defects RIGHT greater than LEFT lungs.  Findings represent a low probability for pulmonary embolism.   Electronically Signed   By: Lavonia Dana M.D.   On: 04/03/2015 12:09    Assessment/Plan Present on Admission:  . HCAP (healthcare-associated pneumonia) . Neutropenia . SOB (shortness of breath) . Lymphoma  PLAN: HCAP in the setting of chronic severe neutropenia of unclear etiology.  I will continue with IV antibiotics, follow WBC count, and consult heme/onc to see if there is any other recommendation.   She is followed by her oncologist at [facility].  Continue neutropenic precaution.   Thanks.   Other plans as per orders.  Code Status: FULL Haskel Khan, MD. [facility] Pager [phone] 7pm to 7am.  04/04/2015, 3:30 PM

## 2015-04-05 DIAGNOSIS — D709 Neutropenia, unspecified: Secondary | ICD-10-CM

## 2015-04-05 NOTE — Progress Notes (Signed)
[facility] PROGRESS NOTE  Wendy Kelley FAO:[id number] DOB: 11-26-1940    PCP:   Marline Backbone, PA-C   HPI:   Wendy Kelley is an 74 y.o. female with hx of lymphoma, unclear staging, hx of chronic neutopenia, HTN, hypothyroidism, Sjogren's syndrome, admitted with HCAP and neutropenia. She is not SOB, and is stable. She was given IV Van/Zosyn, and her WBC was 0.5 K with 6 percent neutrophils, 37percent Mono, and 52 percent lymphocytes yesterday. She has no complaints.   Rewiew of Systems:  Constitutional: Negative for malaise, fever and chills. No significant weight loss or weight gain Eyes: Negative for eye pain, redness and discharge, diplopia, visual changes, or flashes of light. ENMT: Negative for ear pain, hoarseness, nasal congestion, sinus pressure and sore throat. No headaches; tinnitus, drooling, or problem swallowing. Cardiovascular: Negative for chest pain, palpitations, diaphoresis, dyspnea and peripheral edema. ; No orthopnea, PND Respiratory: Negative for cough, hemoptysis, wheezing and stridor. No pleuritic chestpain. Gastrointestinal: Negative for nausea, vomiting, diarrhea, constipation, abdominal pain, melena, blood in stool, hematemesis, jaundice and rectal bleeding.    Genitourinary: Negative for frequency, dysuria, incontinence,flank pain and hematuria; Musculoskeletal: Negative for back pain and neck pain. Negative for swelling and trauma.;  Skin: . Negative for pruritus, rash, abrasions, bruising and skin lesion.; ulcerations Neuro: Negative for headache, lightheadedness and neck stiffness. Negative for weakness, altered level of consciousness , altered mental status, extremity weakness, burning feet, involuntary movement, seizure and syncope.  Psych: negative for anxiety, depression, insomnia, tearfulness, panic attacks, hallucinations, paranoia, suicidal or homicidal ideation     Past Medical History  Diagnosis Date  . Sjogrens syndrome   . Symptomatic  menopausal or female climacteric states   . Osteoporosis   . Hypothyroidism   . Hyperlipidemia   . Hypertension   . Breast cancer     H/O chemotherapy  . Brain cancer   . Lymphoma   . Lymphoma     Past Surgical History  Procedure Laterality Date  . Breast surgery    . Tonsillectomy  1975  . Cholecystectomy, laparoscopic  2006  . Tubal ligation      Medications:  HOME MEDS: Prior to Admission medications   Medication Sig Start Date End Date Taking? Authorizing Provider  fenofibrate micronized (LOFIBRA) 134 MG capsule TAKE ONE CAPSULE BY MOUTH ONE TIME DAILY 12/17/12  Yes Historical Provider, MD  ferrous sulfate 325 (65 FE) MG tablet Take 325 mg by mouth daily with breakfast.  06/25/13  Yes Historical Provider, MD  hydroxychloroquine (PLAQUENIL) 200 MG tablet Take 200 mg by mouth 2 (two) times daily.  06/25/13  Yes Historical Provider, MD  levofloxacin (LEVAQUIN) 500 MG tablet TAKE 1 TABLET (500 MG TOTAL)    BY MOUTH DAILY WHILE NEUTROPENIC. 12/04/14  Yes Historical Provider, MD  levothyroxine (SYNTHROID, LEVOTHROID) 50 MCG tablet Take 50 mcg by mouth daily.     Yes Historical Provider, MD  magnesium oxide (MAG-OX) 400 MG tablet Take 400 mg by mouth daily.   Yes Historical Provider, MD  megestrol (MEGACE) 40 MG/ML suspension Take 400 mg by mouth daily.  10/08/14  Yes Historical Provider, MD  nitrofurantoin, macrocrystal-monohydrate, (MACROBID) 100 MG capsule Take 1 capsule (100 mg total) by mouth 2 (two) times daily. Patient taking differently: Take 100 mg by mouth at bedtime. continuous 01/15/15  Yes Tiffany A Gann, PA-C  omeprazole (PRILOSEC) 20 MG capsule TAKE ONE CAPSULE BY MOUTH ONE TIME DAILY 12/29/14  Yes Historical Provider, MD  PARoxetine (PAXIL) 20 MG tablet TAKE  ONE TABLET BY MOUTH EVERY MORNING 12/25/14  Yes Historical Provider, MD  potassium chloride (K-DUR) 10 MEQ tablet Take 20 mEq by mouth daily.   Yes Historical Provider, MD  predniSONE (DELTASONE) 5 MG tablet Take 5 mg  by mouth daily. 02/23/15  Yes Historical Provider, MD  simvastatin (ZOCOR) 20 MG tablet Take 20 mg by mouth at bedtime.    Yes Historical Provider, MD  amoxicillin (AMOXIL) 500 MG capsule Take 500 mg by mouth as directed. Take four ills one hour before dental work and dental cleaning as long as catheter is in place    Historical Provider, MD  cyanocobalamin (,VITAMIN B-12,) 1000 MCG/ML injection Inject 1,000 mcg into the muscle every 30 (thirty) days.  02/25/15   Historical Provider, MD  diphenhydrAMINE (BENADRYL) 25 mg capsule Take two pills one hour prior to CT scans 07/16/13   Historical Provider, MD  ketoconazole (NIZORAL) 2 % cream Apply 1 application topically 2 (two) times daily. Patient not taking: Reported on 04/02/2015 01/29/15   Tiffany A Gann, PA-C  mupirocin ointment (BACTROBAN) 2 % Apply to AA of arm BID x 14 days Patient not taking: Reported on 04/02/2015 01/29/15   Tiffany A Gann, PA-C  sulfamethoxazole-trimethoprim (BACTRIM DS,SEPTRA DS) 800-160 MG per tablet Take 1 tablet by mouth 2 (two) times daily. Patient not taking: Reported on 04/02/2015 01/10/15   Adella Nissen, PA-C     Allergies:  Allergies  Allergen Reactions  . Adhesive [Tape]   . Iodinated Diagnostic Agents Hives  . Iodine Rash    Social History:   reports that she has never smoked. She has never used smokeless tobacco. She reports that she does not drink alcohol or use illicit drugs.  Family History: Family History  Problem Relation Age of Onset  . Hypertension Mother   . Diabetes type II Mother   . Heart attack Mother     Died at age 75 of MI  . Heart disease Mother   . Diabetes Mother   . Hypothyroidism Sister      Physical Exam: Filed Vitals:   04/04/15 0555 04/04/15 1420 04/04/15 2216 04/05/15 0623  BP: 133/62 125/69 132/72 147/76  Pulse: 119 123 94 112  Temp: 100 F (37.8 C) 98.8 F (37.1 C) 98 F (36.7 C) 97.6 F (36.4 C)  TempSrc: Oral Oral Oral Oral  Resp: 20 20 20 20   Height:       Weight:      SpO2: 98% 99% 98% 98%   Blood pressure 147/76, pulse 112, temperature 97.6 F (36.4 C), temperature source Oral, resp. rate 20, height 5\' 3"  (1.6 m), weight 56.473 kg (124 lb 8 oz), SpO2 98 %.  GEN:  Pleasant patient lying in the stretcher in no acute distress; cooperative with exam. PSYCH:  alert and oriented x4; does not appear anxious or depressed; affect is appropriate. HEENT: Mucous membranes pink and anicteric; PERRLA; EOM intact; no cervical lymphadenopathy nor thyromegaly or carotid bruit; no JVD; There were no stridor. Neck is very supple. Breasts:: Not examined CHEST WALL: No tenderness CHEST: Normal respiration, clear to auscultation bilaterally.  HEART: Regular rate and rhythm.  There are no murmur, rub, or gallops.   BACK: No kyphosis or scoliosis; no CVA tenderness ABDOMEN: soft and non-tender; no masses, no organomegaly, normal abdominal bowel sounds; no pannus; no intertriginous candida. There is no rebound and no distention. Rectal Exam: Not done EXTREMITIES: No bone or joint deformity; age-appropriate arthropathy of the hands and knees; no edema; no  ulcerations.  There is no calf tenderness. Genitalia: not examined PULSES: 2+ and symmetric SKIN: Normal hydration no rash or ulceration CNS: Cranial nerves 2-12 grossly intact no focal lateralizing neurologic deficit.  Speech is fluent; uvula elevated with phonation, facial symmetry and tongue midline. DTR are normal bilaterally, cerebella exam is intact, barbinski is negative and strengths are equaled bilaterally.  No sensory loss.   Labs on Admission:  Basic Metabolic Panel:  Recent Labs Lab 04/02/15 2117  NA 132*  K 4.2  CL 98*  CO2 26  GLUCOSE 122*  BUN 20  CREATININE 0.93  CALCIUM 8.4*   Liver Function Tests: No results for input(s): AST, ALT, ALKPHOS, BILITOT, PROT, ALBUMIN in the last 168 hours. No results for input(s): LIPASE, AMYLASE in the last 168 hours. No results for input(s): AMMONIA  in the last 168 hours. CBC:  Recent Labs Lab 04/02/15 2117 04/04/15 0616  WBC 0.6* 0.5*  NEUTROABS 0.0* 0.0*  HGB 10.4* 9.9*  HCT 32.3* 31.1*  MCV 97.3 97.5  PLT 175 161   Cardiac Enzymes:  Recent Labs Lab 04/02/15 2117  TROPONINI 0.10*   Assessment/Plan Present on Admission:  . HCAP (healthcare-associated pneumonia) . Neutropenia ([place]) . SOB (shortness of breath) . Lymphoma ([facility])    PLAN:  HCAP in the setting of chronic severe neutropenia of unclear etiology. I will continue with IV antibiotics, recheck WBC in the morning, and consult heme/onc to see if there is any other recommendation. She is followed by her oncologist at [facility]. Continue neutropenic precaution. Thanks.   Other plans as per orders.  Code Status:FULL CODE.    Orvan Falconer, MD. [facility] Pager [phone] 7pm to 7am.  04/05/2015, 12:21 PM

## 2015-04-06 LAB — BASIC METABOLIC PANEL
Anion gap: 7 (ref 5–15)
BUN: 14 mg/dL (ref 6–20)
CHLORIDE: 98 mmol/L — AB (ref 101–111)
CO2: 29 mmol/L (ref 22–32)
CREATININE: 0.66 mg/dL (ref 0.44–1.00)
Calcium: 8.4 mg/dL — ABNORMAL LOW (ref 8.9–10.3)
GFR calc Af Amer: >60 mL/min (ref >60)
GFR calc non Af Amer: >60 mL/min (ref >60)
GLUCOSE: 79 mg/dL (ref 65–99)
POTASSIUM: 3.2 mmol/L — AB (ref 3.5–5.1)
Sodium: 134 mmol/L — ABNORMAL LOW (ref 135–145)

## 2015-04-06 LAB — CBC
HEMATOCRIT: 33.3 % — AB (ref 36.0–46.0)
Hemoglobin: 10.5 g/dL — ABNORMAL LOW (ref 12.0–15.0)
MCH: 30.8 pg (ref 26.0–34.0)
MCHC: 31.5 g/dL (ref 30.0–36.0)
MCV: 97.7 fL (ref 78.0–100.0)
PLATELETS: 182 10*3/uL (ref 150–400)
RBC: 3.41 MIL/uL — ABNORMAL LOW (ref 3.87–5.11)
RDW: 15.3 % (ref 11.5–15.5)
WBC: 0.5 10*3/uL — AB (ref 4.0–10.5)

## 2015-04-06 LAB — LEGIONELLA PNEUMOPHILA SEROGP 1 UR AG: L. PNEUMOPHILA SEROGP 1 UR AG: NEGATIVE

## 2015-04-06 LAB — PATHOLOGIST SMEAR REVIEW

## 2015-04-06 MED ORDER — AMOXICILLIN-POT CLAVULANATE 875-125 MG PO TABS: 1.0000 | ORAL_TABLET | Freq: Two times a day (BID) | ORAL | Status: AC

## 2015-04-06 MED ORDER — AMOXICILLIN-POT CLAVULANATE 875-125 MG PO TABS: 1.0000 | ORAL_TABLET | Freq: Two times a day (BID) | ORAL | Status: DC

## 2015-04-06 NOTE — Consult Note (Signed)
Patient's chart reviewed in detail.  Notes reviewed from [facility] via [place].  Patient's primary hematolgist is Dr. Cassell Clement at [facility].  Per telephone encounters, Dr. Cassell Clement recently performed a bone marrow aspiration and biopsy on 9/21.  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  As demonstrated below, Dr. Cassell Clement is aware of patient's hospitalization and has plans for outpatient follow-up in [facility] [place] [place] on 10/10.  Patient not seen by hematology today.  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  Her counts, according to [facility] are stable dating back to at least June 2016.  Her primary hematologist, as mentioned above, is Dr. Cassell Clement.  No further input available from hematology standpoint.   Recommend outpatient follow-up with Dr. Cassell Clement as scheduled on 10/10.  Recommend continued treatment for HCAP.  Recommend transfer of patient to [facility] if concerns persist or patient's condition worsens.   Telephone Encounter - Jacinto Reap - 04/03/2015 2:46 PM EDT Appointments have been scheduled as requested. Thank you.  Back to top of Miscellaneous Notes Telephone Encounter - Hulda Marin, RN - 04/03/2015 2:13 PM EDT Called and informed pt's son Dominica Severin about message below per Dr Cassell Clement. He states that patient is currently admitted at [facility] for pneumonia (records up to date in care everywhere). Informed him we will go ahead and schedule an appt for 10/10 but to let us know if anything changes. He verbalized understanding. Will forward FYI to Dr Cassell Clement regarding admission.  Schedulers--please arrange for lab and f/u in [place] on 10/10, please call with appts.   Electronically signed by: Hulda Marin, RN 04/03/15 1416  Back to top of Miscellaneous Notes Telephone Encounter - Hessie Dibble, MD - 04/03/2015 10:10 AM  EDT Yes please. 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 I will be talking to some of our physicians who are experts in treating myelodysplasia. I would like to see her for follow up at [place] on 10/10 at 1 pm. She should have a CDP, CMP that day.  Thank you  Electronically signed by: Hessie Dibble, MD 04/03/15 1014  Back to top of Miscellaneous Notes Telephone Encounter - Hulda Marin, RN - 04/03/2015 9:02 AM EDT Let me know if you would like for me to call with the results  Electronically signed by: Hulda Marin, RN 04/03/15 0902  Back to top of Miscellaneous Notes Telephone Encounter - Luberta Robertson, RN - 04/02/2015 2:20 PM EDT Patient had labs and BM bx on 9/21. She is calling for results. Electronically signed by: Luberta Robertson, RN 04/02/2015 2:20 PM   Electronically signed by: Luberta Robertson, RN 04/02/15 1427  Back to top of Miscellaneous Notes Telephone Encounter - Mariane Baumgarten - 04/02/2015 2:16 PM EDT Copied from [place] [phone]. Topic: Clinical Issue - Test Results  >> Apr 02, 2015 2:14 PM Rinaldo Cloud [place] wrote: Calling to get results of recent lab work. Please return call to [phone] or [phone]    Patient and plan discussed with Dr. Ancil Linsey and she is in agreement with the aforementioned.   Iman Orourke, PA-C 04/06/2015 8:32 AM

## 2015-04-06 NOTE — Care Management Important Message (Signed)
Important Message  Patient Details  Name: Wendy Kelley MRN: [id number] Date of Birth: 1941-02-01   Medicare Important Message Given:  Yes-third notification given    Sherald Barge, RN 04/06/2015, 2:04 PM

## 2015-04-06 NOTE — Discharge Summary (Signed)
Physician Discharge Summary  Wendy Kelley WLS:[id number] DOB: 04-24-1941 DOA: 04/02/2015  PCP: Wendy Backbone, PA-C  Admit date: 04/02/2015 Discharge date: 04/06/2015  Time spent: 35 minutes  Recommendations for Outpatient Follow-up:  1. Follow up with your oncologist at [facility]. 2. See your PCP next week for follow up.   Discharge Diagnoses:  Principal Problem:   SOB (shortness of breath) Active Problems:   Lymphoma (HCC)   HCAP (healthcare-associated pneumonia)   Neutropenia ([place])   Discharge Condition: Improved.   Diet recommendation: As tolerated.    Filed Weights   04/02/15 2145 04/03/15 0037  Weight: 52.164 kg (115 lb) 56.473 kg (124 lb 8 oz)    History of present illness: patient was admitted by Dr Wendy Kelley on Sept 30, 2016 for SOB and HCAP.  As per her H and P:  " 74 yo female h/o lymphoma (unknown extent or staging) sent to hospital today for ct chest for 2 weeks of sob. Pt went to see her PCP today, who was worried about a PE. Ct chest was ordered however without contrast. She has been treated with a zpack 2 weeks ago for possible pna. Today she had follow up to see if her cxr was clearer. Sent to ct. After ct was obtained, was abnormal sent to the ED. Pt denies any fevers at home. No cough. No swelling anywhere in legs. No chest pain. She has not been able to get her chemo for 3 months because of low wbc. She has not idea where her lymphoma is or what stage. She gets all her care at [facility]. Has been on chemo for over 4 years. Pt referred for admission for abnormal ct scan possible hcap. Pt has allergy to dye.   Hospital Course: patient was admitted into the hospital, and she was started on IV antibiotics including IV vancomycin and IV Fortaz.  There was concerned about a PE, so she was given Lovenox, and a V/Q was performed the following day, which was negative for PE.   It was noted that she remained neutropenic, but stable, with WBC of 0.5K,  with 6 percent neutrophils, 37percent Mono, and 52 percent lymphocytes.  She was placed on neutropenic precaution.  She improved, and has been relatively very asymptomatic.  Heme-Onc was consulted, as she was having neutropenia, and recommended that she follow up with her oncologist at [facility], and that it was likely from [place].   I noted that she has been on Placquanil, which can cause further bone marrow suppression, so it was placed on hold.  She is anxious to be discharged home, and is stable for discharge.  She will follow up with her PCP in a week, and with her oncologist at [facility] as per our recommendation.  She will complete another week of Augmentin 875mg  BID.  Thank you so much for allowing me to participate in her care.    Procedures:  Ventilation Perfusion Scan.     Consultations:  Heme/Onc.   Discharge Exam: Filed Vitals:   04/06/15 0545  BP: 132/58  Pulse: 112  Temp: 98.3 F (36.8 C)  Resp: 20     Discharge Instructions    Diet - low sodium heart healthy    Complete by:  As directed      Discharge instructions    Complete by:  As directed   Take your medicine as directed.  Follow up with your oncologist at [facility] next week.     Increase activity  slowly    Complete by:  As directed           Current Discharge Medication List    START taking these medications   Details  amoxicillin-clavulanate (AUGMENTIN) 875-125 MG tablet Take 1 tablet by mouth every 12 (twelve) hours. Qty: 14 tablet, Refills: 0      CONTINUE these medications which have NOT CHANGED   Details  fenofibrate micronized (LOFIBRA) 134 MG capsule TAKE ONE CAPSULE BY MOUTH ONE TIME DAILY    ferrous sulfate 325 (65 FE) MG tablet Take 325 mg by mouth daily with breakfast.     levothyroxine (SYNTHROID, LEVOTHROID) 50 MCG tablet Take 50 mcg by mouth daily.      megestrol (MEGACE) 40 MG/ML suspension Take 400 mg by mouth daily.     omeprazole (PRILOSEC) 20 MG capsule  TAKE ONE CAPSULE BY MOUTH ONE TIME DAILY    PARoxetine (PAXIL) 20 MG tablet TAKE  ONE TABLET BY MOUTH EVERY MORNING    potassium chloride (K-DUR) 10 MEQ tablet Take 20 mEq by mouth daily.    predniSONE (DELTASONE) 5 MG tablet Take 5 mg by mouth daily.    simvastatin (ZOCOR) 20 MG tablet Take 20 mg by mouth at bedtime.     cyanocobalamin (,VITAMIN B-12,) 1000 MCG/ML injection Inject 1,000 mcg into the muscle every 30 (thirty) days.     diphenhydrAMINE (BENADRYL) 25 mg capsule Take two pills one hour prior to CT scans      STOP taking these medications     hydroxychloroquine (PLAQUENIL) 200 MG tablet      levofloxacin (LEVAQUIN) 500 MG tablet      magnesium oxide (MAG-OX) 400 MG tablet      nitrofurantoin, macrocrystal-monohydrate, (MACROBID) 100 MG capsule      amoxicillin (AMOXIL) 500 MG capsule      ketoconazole (NIZORAL) 2 % cream      mupirocin ointment (BACTROBAN) 2 %      sulfamethoxazole-trimethoprim (BACTRIM DS,SEPTRA DS) 800-160 MG per tablet        Allergies  Allergen Reactions  . Adhesive [Tape]   . Iodinated Diagnostic Agents Hives  . Iodine Rash      The results of significant diagnostics from this hospitalization (including imaging, microbiology, ancillary and laboratory) are listed below for reference.    Significant Diagnostic Studies: Dg Chest 2 View  04/02/2015   CLINICAL DATA:  Shortness of breath.  Follow up pulmonary nodule.  EXAM: CHEST  2 VIEW  COMPARISON:  Radiographs 03/18/2015 and 05/19/2014.  CT 12/20/2007.  FINDINGS: Left IJ Port-A-Cath tip is unchanged at the SVC right atrial junction. The heart size and mediastinal contours are stable. There is a persistent right upper lobe nodular density overlapping the posterior aspect of the right fifth rib on the frontal examination. This is ill-defined, although potentially slightly larger than on the most recent study. No confluent airspace opacity or other nodules identified. There is no pleural  effusion. The bones appear unchanged.  IMPRESSION: Persistent and slightly larger nodular density in the right lung. This may reflect focal inflammation or a developing neoplasm. CT recommended for further evaluation.   Electronically Signed   By: Richardean Sale M.D.   On: 04/02/2015 16:24   Dg Chest 2 View  03/18/2015   CLINICAL DATA:  Wheezing.  EXAM: CHEST  2 VIEW  COMPARISON:  May 19, 2014.  FINDINGS: The heart size and mediastinal contours are within normal limits. No pneumothorax or pleural effusion is noted. Left internal jugular Port-A-Cath is  noted with distal tip at the expected position the cavoatrial junction. Left lung is clear. New ill-defined density is noted laterally in the right upper lobe which may represent focal inflammation or atelectasis. The visualized skeletal structures are unremarkable.  IMPRESSION: New ill-defined density seen laterally in the right upper lobe which may represent focal atelectasis or pneumonia. Short-term follow-up radiographs are recommended to ensure resolution and rule out the possibility of underlying mass or neoplasm.   Electronically Signed   By: Marijo Conception, M.D.   On: 03/18/2015 15:07   Ct Chest Wo Contrast  04/02/2015   CLINICAL DATA:  Shortness of breath, undergoing chemotherapy for lymphoma  EXAM: CT CHEST WITHOUT CONTRAST  TECHNIQUE: Multidetector CT imaging of the chest was performed following the standard protocol without IV contrast.  COMPARISON:  Chest radiograph same date, most recent chest CT 12/20/2007  FINDINGS: Mediastinum/Nodes: Left-sided Port-A-Cath in place with tip at the distal SVC. Heart size normal. Trace pericardial fluid.  Evidence of right mastectomy. No masses identified in the right mastectomy bed.  Mediastinal lymphadenopathy noted with representative nodes as follows:  Pretracheal node 1 cm, image 16.  Conglomerate precarinal nodes, 1.3 cm image 24.  Retrocrural 0.9 cm node, image 54.  Lungs/Pleura: Right apical  subpleural probable scarring image 12. Multiple bilateral diffuse areas of irregular pulmonary airspace opacities are identified, some of which demonstrate internal air bronchogram formation. The largest of these in the right upper lobe measures image 22.  Central airways are patent.  Upper abdomen: Hepatic presumed granulomatous calcifications are reidentified but not well visualized due to motion. Cholecystectomy clips are noted. Gastrohepatic lymphadenopathy, 0.6 cm image 55.  Musculoskeletal: No acute osseous abnormality. Lytic osseous lesions most prominent in the thoracic spine are again noted and not significantly changed.  IMPRESSION: Multi lobar diffuse ill-defined irregular patchy airspace opacities, with air bronchogram formation. Differential considerations include infection/inflammation, pneumonitis/treatment effect, and metastatic disease. These are new and increased since the prior exam of 2009.  Multi station lymphadenopathy, compatible with lymphoma. This is not significantly changed.  Lytic osseous presumed metastatic disease again noted. This is also not significantly changed since the 2009 prior exam.   Electronically Signed   By: Conchita Paris M.D.   On: 04/02/2015 17:41   Nm Pulmonary Perf And Vent  04/03/2015   CLINICAL DATA:  Long history a shortness of breath with symptoms worsening in past year particularly last month, breast cancer, lymphoma, Sjogrens  EXAM: NUCLEAR MEDICINE VENTILATION - PERFUSION LUNG SCAN  TECHNIQUE: Ventilation images were obtained in multiple projections using inhaled aerosol Tc-59m DTPA. Perfusion images were obtained in multiple projections after intravenous injection of Tc-55m MAA.  RADIOPHARMACEUTICALS:  33 mCi Technetium-27m DTPA aerosol inhalation and 3.8 mCi Technetium-66m MAA IV  COMPARISON:  None; correlation chest radiograph 04/02/2015, noncontrast CT chest 04/02/2015  FINDINGS: Ventilation: Small subsegmental ventilation defects are identified in the  periphery of both lungs. Central airway deposition of aerosol.  Perfusion: Small peripheral subsegmental perfusion defects matching to ventilatory findings. No additional perfusion defects identified.  Chest radiograph: Vague nodular density RIGHT upper lobe by chest radiography with chest CT demonstrating scattered ill-defined multi lobar patchy irregular airspace opacities.  IMPRESSION: Matching small peripheral subsegmental ventilation and perfusion defects RIGHT greater than LEFT lungs.  Findings represent a low probability for pulmonary embolism.   Electronically Signed   By: Lavonia Dana M.D.   On: 04/03/2015 12:09    Microbiology: No results found for this or any previous visit (from the past 240  hour(s)).   Labs: Basic Metabolic Panel:  Recent Labs Lab 04/02/15 2117 04/06/15 0604  NA 132* 134*  K 4.2 3.2*  CL 98* 98*  CO2 26 29  GLUCOSE 122* 79  BUN 20 14  CREATININE 0.93 0.66  CALCIUM 8.4* 8.4*   CBC:  Recent Labs Lab 04/02/15 2117 04/04/15 0616 04/06/15 0604  WBC 0.6* 0.5* 0.5*  NEUTROABS 0.0* 0.0*  --   HGB 10.4* 9.9* 10.5*  HCT 32.3* 31.1* 33.3*  MCV 97.3 97.5 97.7  PLT 175 161 182   Cardiac Enzymes:  Recent Labs Lab 04/02/15 2117  TROPONINI 0.10*   Signed:  Chelcea Zahn  [facility] 04/06/2015, 12:36 PM

## 2015-04-06 NOTE — Care Management Note (Signed)
Case Management Note  Patient Details  Name: Wendy Kelley MRN: [id number] Date of Birth: September 21, 1940  Expected Discharge Date:      04/06/2015            Expected Discharge Plan:  Home/Self Care  In-House Referral:  NA  Discharge planning Services  CM Consult  Post Acute Care Choice:  NA Choice offered to:  NA  DME Arranged:    DME Agency:     HH Arranged:    [place] Agency:     Status of Service:  Completed, signed off  Medicare Important Message Given:  Yes-third notification given Date Medicare IM Given:    Medicare IM give by:    Date Additional Medicare IM Given:    Additional Medicare Important Message give by:     If discussed at [place] of Stay Meetings, dates discussed:    Additional Comments: Pt being discharged home with self care today. No CM needs noted.  Sherald Barge, RN 04/06/2015, 2:04 PM

## 2015-04-07 LAB — CBC
HCT: 31.9 % — ABNORMAL LOW (ref 36.0–46.0)
Hemoglobin: 10.2 g/dL — ABNORMAL LOW (ref 12.0–15.0)
MCH: 31.3 pg (ref 26.0–34.0)
MCHC: 32 g/dL (ref 30.0–36.0)
MCV: 97.9 fL (ref 78.0–100.0)
PLATELETS: 165 10*3/uL (ref 150–400)
RBC: 3.26 MIL/uL — ABNORMAL LOW (ref 3.87–5.11)
RDW: 15.3 % (ref 11.5–15.5)
WBC: 0.5 10*3/uL — AB (ref 4.0–10.5)

## 2015-04-07 NOTE — Evaluation (Signed)
Physical Therapy Evaluation Patient Details Name: Wendy Kelley MRN: [id number] DOB: 1941/02/18 Today's Date: 04/07/2015   History of Present Illness  74 yo female h/o lymphoma (unknown extent or staging) sent to hospital today for ct chest for 2 weeks of sob. Pt went to see her PCP today, who was worried about a PE. Ct chest was ordered however without contrast. She has been treated with a zpack 2 weeks ago for possible pna. Today she had follow up to see if her cxr was clearer. Sent to ct. After ct was obtained, was abnormal sent to the ED. Pt denies any fevers at home. No cough. No swelling anywhere in legs. No chest pain. She has not been able to get her chemo for 3 months because of low wbc. She has not idea where her lymphoma is or what stage. She gets all her care at [facility]. Has been on chemo for over 4 years. Pt referred for admission for abnormal ct scan possible hcap. Pt has allergy to dye.  Clinical Impression   Pt was seen for evaluation prior to discharge.  Son is concerned because pt has become very weak with this recent illness.  Pt has full time CG at home and has been minimally ambulatory at home.  Mobility has been via w/c.  She is found to be significantly deconditioned, able to ambulate only 20' with a walker.  She has a very narrow based gait with causes intrinsic instability.  She is able to transfer without assist.  She would benefit from HHPT for which pt is agreeable.    Follow Up Recommendations Home health PT    Equipment Recommendations  None recommended by PT    Recommendations for Other Services   none    Precautions / Restrictions Precautions Precautions: Fall Restrictions Weight Bearing Restrictions: No      Mobility  Bed Mobility Overal bed mobility: Modified Independent                Transfers Overall transfer level: Needs assistance Equipment used: Rolling walker (2 wheeled) Transfers: Sit to/from Stand Sit to Stand:  Supervision            Ambulation/Gait Ambulation/Gait assistance: Min guard Ambulation Distance (Feet): 20 Feet Assistive device: Rolling walker (2 wheeled) Gait Pattern/deviations: Narrow base of support;Decreased dorsiflexion - right;Decreased dorsiflexion - left;Shuffle   Gait velocity interpretation: <1.8 ft/sec, indicative of risk for recurrent falls    Stairs            Wheelchair Mobility    Modified Rankin (Stroke Patients Only)       Balance Overall balance assessment: No apparent balance deficits (not formally assessed)                                           Pertinent Vitals/Pain Pain Assessment: No/denies pain    Home Living Family/patient expects to be discharged to:: Private residence Living Arrangements: Spouse/significant other;Children Available Help at Discharge: Family;Available 24 hours/day;Personal care attendant Type of Home: House Home Access: Ramped entrance     Home Layout: One level Home Equipment: [place] - 2 wheels;Cane - single point;Wheelchair - manual      Prior Function Level of Independence: Needs assistance   Gait / Transfers Assistance Needed: due to recent weakness has been needing a walker for gait as well as close guarding...able to transfer independently  ADL's / Homemaking  Assistance Needed: assist with grooming, dressing        Hand Dominance        Extremity/Trunk Assessment               Lower Extremity Assessment: Generalized weakness (deconditioned)         Communication   Communication: No difficulties  Cognition Arousal/Alertness: Awake/alert Behavior During Therapy: WFL for tasks assessed/performed Overall Cognitive Status: Within Functional Limits for tasks assessed                      General Comments      Exercises        Assessment/Plan    PT Assessment All further PT needs can be met in the next venue of care  PT Diagnosis     PT Problem  List Decreased strength;Decreased activity tolerance;Decreased mobility  PT Treatment Interventions     PT Goals (Current goals can be found in the Care Plan section) Acute Rehab PT Goals PT Goal Formulation: All assessment and education complete, DC therapy    Frequency     Barriers to discharge        Co-evaluation               End of Session Equipment Utilized During Treatment: Gait belt Activity Tolerance: Patient tolerated treatment well;Patient limited by fatigue Patient left: in bed;with call bell/phone within reach;with family/visitor present Nurse Communication: Mobility status         Time: 1235-1310 PT Time Calculation (min) (ACUTE ONLY): 35 min   Charges:   PT Evaluation $Initial PT Evaluation Tier I: 1 Procedure     PT G CodesDemetrios Isaacs L  PT 04/07/2015, 1:45 PM [phone]

## 2015-04-07 NOTE — Discharge Summary (Signed)
Patient was not discharged yesterday as she had spiked a fever with temp of 101.  She was kept in the hospital.  She had fever, and her repeated WBC was stable, though chronically low at 0.5K.  She is now able to be discharged.  Her BC remained negative.  Discharge summary done yesterday is valid.

## 2015-04-07 NOTE — Care Management Note (Signed)
Case Management Note  Patient Details  Name: Wendy Kelley MRN: [id number] Date of Birth: Mar 08, 1941  Expected Discharge Date:                  Expected Discharge Plan:  [place]  In-House Referral:  NA  Discharge planning Services  CM Consult  Post Acute Care Choice:  Home Health Choice offered to:  Patient  DME Arranged:    DME Agency:     HH Arranged:  RN, PT HH Agency:  [place]  Status of Service:  Completed, signed off  Medicare Important Message Given:  Yes-third notification given Date Medicare IM Given:    Medicare IM give by:    Date Additional Medicare IM Given:    Additional Medicare Important Message give by:     If discussed at [place] of Stay Meetings, dates discussed:  04/07/2015  Additional Comments: Pt being discharged home with [name] services today. Pt's son in room to provide transport home. Pt has chosen [name] for [facility] RN/PT services. Pt aware HH has 48 hours to make first visit. Richrd Prime, of [name], made aware of referral and will obtain pt info from chart. No further CM needs noted.  Sherald Barge, RN 04/07/2015, 2:35 PM

## 2015-04-08 LAB — URINE CULTURE: Culture: NO GROWTH

## 2015-04-09 DIAGNOSIS — R32 Unspecified urinary incontinence: Secondary | ICD-10-CM | POA: Diagnosis not present

## 2015-04-09 DIAGNOSIS — Z85841 Personal history of malignant neoplasm of brain: Secondary | ICD-10-CM | POA: Diagnosis not present

## 2015-04-09 DIAGNOSIS — C969 Malignant neoplasm of lymphoid, hematopoietic and related tissue, unspecified: Secondary | ICD-10-CM | POA: Diagnosis not present

## 2015-04-09 DIAGNOSIS — M3500 Sicca syndrome, unspecified: Secondary | ICD-10-CM | POA: Diagnosis not present

## 2015-04-09 DIAGNOSIS — J189 Pneumonia, unspecified organism: Secondary | ICD-10-CM | POA: Diagnosis not present

## 2015-04-09 DIAGNOSIS — Z853 Personal history of malignant neoplasm of breast: Secondary | ICD-10-CM | POA: Diagnosis not present

## 2015-04-09 DIAGNOSIS — I10 Essential (primary) hypertension: Secondary | ICD-10-CM | POA: Diagnosis not present

## 2015-04-10 DIAGNOSIS — J189 Pneumonia, unspecified organism: Secondary | ICD-10-CM | POA: Diagnosis not present

## 2015-04-10 DIAGNOSIS — R32 Unspecified urinary incontinence: Secondary | ICD-10-CM | POA: Diagnosis not present

## 2015-04-10 DIAGNOSIS — Z85841 Personal history of malignant neoplasm of brain: Secondary | ICD-10-CM | POA: Diagnosis not present

## 2015-04-10 DIAGNOSIS — C969 Malignant neoplasm of lymphoid, hematopoietic and related tissue, unspecified: Secondary | ICD-10-CM | POA: Diagnosis not present

## 2015-04-10 DIAGNOSIS — I10 Essential (primary) hypertension: Secondary | ICD-10-CM | POA: Diagnosis not present

## 2015-04-10 DIAGNOSIS — Z853 Personal history of malignant neoplasm of breast: Secondary | ICD-10-CM | POA: Diagnosis not present

## 2015-04-10 DIAGNOSIS — M3500 Sicca syndrome, unspecified: Secondary | ICD-10-CM | POA: Diagnosis not present

## 2015-04-13 DIAGNOSIS — C8200 Follicular lymphoma grade I, unspecified site: Secondary | ICD-10-CM | POA: Diagnosis not present

## 2015-04-13 LAB — CULTURE, BLOOD (ROUTINE X 2)
Culture: NO GROWTH
Culture: NO GROWTH

## 2015-04-14 DIAGNOSIS — J189 Pneumonia, unspecified organism: Secondary | ICD-10-CM | POA: Diagnosis not present

## 2015-04-14 DIAGNOSIS — R32 Unspecified urinary incontinence: Secondary | ICD-10-CM | POA: Diagnosis not present

## 2015-04-14 DIAGNOSIS — I10 Essential (primary) hypertension: Secondary | ICD-10-CM | POA: Diagnosis not present

## 2015-04-14 DIAGNOSIS — Z853 Personal history of malignant neoplasm of breast: Secondary | ICD-10-CM | POA: Diagnosis not present

## 2015-04-14 DIAGNOSIS — M3500 Sicca syndrome, unspecified: Secondary | ICD-10-CM | POA: Diagnosis not present

## 2015-04-14 DIAGNOSIS — Z85841 Personal history of malignant neoplasm of brain: Secondary | ICD-10-CM | POA: Diagnosis not present

## 2015-04-14 DIAGNOSIS — C969 Malignant neoplasm of lymphoid, hematopoietic and related tissue, unspecified: Secondary | ICD-10-CM | POA: Diagnosis not present

## 2015-04-15 DIAGNOSIS — J189 Pneumonia, unspecified organism: Secondary | ICD-10-CM | POA: Diagnosis not present

## 2015-04-15 DIAGNOSIS — Z853 Personal history of malignant neoplasm of breast: Secondary | ICD-10-CM | POA: Diagnosis not present

## 2015-04-15 DIAGNOSIS — C969 Malignant neoplasm of lymphoid, hematopoietic and related tissue, unspecified: Secondary | ICD-10-CM | POA: Diagnosis not present

## 2015-04-15 DIAGNOSIS — Z85841 Personal history of malignant neoplasm of brain: Secondary | ICD-10-CM | POA: Diagnosis not present

## 2015-04-15 DIAGNOSIS — R32 Unspecified urinary incontinence: Secondary | ICD-10-CM | POA: Diagnosis not present

## 2015-04-15 DIAGNOSIS — M3500 Sicca syndrome, unspecified: Secondary | ICD-10-CM | POA: Diagnosis not present

## 2015-04-15 DIAGNOSIS — I10 Essential (primary) hypertension: Secondary | ICD-10-CM | POA: Diagnosis not present

## 2015-04-16 DIAGNOSIS — I10 Essential (primary) hypertension: Secondary | ICD-10-CM | POA: Diagnosis not present

## 2015-04-16 DIAGNOSIS — J189 Pneumonia, unspecified organism: Secondary | ICD-10-CM | POA: Diagnosis not present

## 2015-04-16 DIAGNOSIS — Z853 Personal history of malignant neoplasm of breast: Secondary | ICD-10-CM | POA: Diagnosis not present

## 2015-04-16 DIAGNOSIS — C969 Malignant neoplasm of lymphoid, hematopoietic and related tissue, unspecified: Secondary | ICD-10-CM | POA: Diagnosis not present

## 2015-04-16 DIAGNOSIS — R32 Unspecified urinary incontinence: Secondary | ICD-10-CM | POA: Diagnosis not present

## 2015-04-16 DIAGNOSIS — Z85841 Personal history of malignant neoplasm of brain: Secondary | ICD-10-CM | POA: Diagnosis not present

## 2015-04-16 DIAGNOSIS — M3500 Sicca syndrome, unspecified: Secondary | ICD-10-CM | POA: Diagnosis not present

## 2015-04-20 DIAGNOSIS — R0602 Shortness of breath: Secondary | ICD-10-CM | POA: Diagnosis not present

## 2015-04-20 DIAGNOSIS — R0609 Other forms of dyspnea: Secondary | ICD-10-CM | POA: Diagnosis not present

## 2015-04-20 DIAGNOSIS — R911 Solitary pulmonary nodule: Secondary | ICD-10-CM | POA: Diagnosis not present

## 2015-04-21 DIAGNOSIS — R32 Unspecified urinary incontinence: Secondary | ICD-10-CM | POA: Diagnosis not present

## 2015-04-21 DIAGNOSIS — Z853 Personal history of malignant neoplasm of breast: Secondary | ICD-10-CM | POA: Diagnosis not present

## 2015-04-21 DIAGNOSIS — I10 Essential (primary) hypertension: Secondary | ICD-10-CM | POA: Diagnosis not present

## 2015-04-21 DIAGNOSIS — Z85841 Personal history of malignant neoplasm of brain: Secondary | ICD-10-CM | POA: Diagnosis not present

## 2015-04-21 DIAGNOSIS — J189 Pneumonia, unspecified organism: Secondary | ICD-10-CM | POA: Diagnosis not present

## 2015-04-21 DIAGNOSIS — C969 Malignant neoplasm of lymphoid, hematopoietic and related tissue, unspecified: Secondary | ICD-10-CM | POA: Diagnosis not present

## 2015-04-21 DIAGNOSIS — M3500 Sicca syndrome, unspecified: Secondary | ICD-10-CM | POA: Diagnosis not present

## 2015-04-22 DIAGNOSIS — R32 Unspecified urinary incontinence: Secondary | ICD-10-CM | POA: Diagnosis not present

## 2015-04-22 DIAGNOSIS — I10 Essential (primary) hypertension: Secondary | ICD-10-CM | POA: Diagnosis not present

## 2015-04-22 DIAGNOSIS — Z85841 Personal history of malignant neoplasm of brain: Secondary | ICD-10-CM | POA: Diagnosis not present

## 2015-04-22 DIAGNOSIS — C969 Malignant neoplasm of lymphoid, hematopoietic and related tissue, unspecified: Secondary | ICD-10-CM | POA: Diagnosis not present

## 2015-04-22 DIAGNOSIS — Z853 Personal history of malignant neoplasm of breast: Secondary | ICD-10-CM | POA: Diagnosis not present

## 2015-04-22 DIAGNOSIS — J189 Pneumonia, unspecified organism: Secondary | ICD-10-CM | POA: Diagnosis not present

## 2015-04-22 DIAGNOSIS — M3500 Sicca syndrome, unspecified: Secondary | ICD-10-CM | POA: Diagnosis not present

## 2015-04-23 DIAGNOSIS — Z853 Personal history of malignant neoplasm of breast: Secondary | ICD-10-CM | POA: Diagnosis not present

## 2015-04-23 DIAGNOSIS — C969 Malignant neoplasm of lymphoid, hematopoietic and related tissue, unspecified: Secondary | ICD-10-CM | POA: Diagnosis not present

## 2015-04-23 DIAGNOSIS — R32 Unspecified urinary incontinence: Secondary | ICD-10-CM | POA: Diagnosis not present

## 2015-04-23 DIAGNOSIS — I10 Essential (primary) hypertension: Secondary | ICD-10-CM | POA: Diagnosis not present

## 2015-04-23 DIAGNOSIS — R911 Solitary pulmonary nodule: Secondary | ICD-10-CM | POA: Diagnosis not present

## 2015-04-23 DIAGNOSIS — Z85841 Personal history of malignant neoplasm of brain: Secondary | ICD-10-CM | POA: Diagnosis not present

## 2015-04-23 DIAGNOSIS — M3500 Sicca syndrome, unspecified: Secondary | ICD-10-CM | POA: Diagnosis not present

## 2015-04-23 DIAGNOSIS — J189 Pneumonia, unspecified organism: Secondary | ICD-10-CM | POA: Diagnosis not present

## 2015-04-24 DIAGNOSIS — C969 Malignant neoplasm of lymphoid, hematopoietic and related tissue, unspecified: Secondary | ICD-10-CM | POA: Diagnosis not present

## 2015-04-24 DIAGNOSIS — J189 Pneumonia, unspecified organism: Secondary | ICD-10-CM | POA: Diagnosis not present

## 2015-04-24 DIAGNOSIS — I10 Essential (primary) hypertension: Secondary | ICD-10-CM | POA: Diagnosis not present

## 2015-04-24 DIAGNOSIS — M3500 Sicca syndrome, unspecified: Secondary | ICD-10-CM | POA: Diagnosis not present

## 2015-04-24 DIAGNOSIS — Z853 Personal history of malignant neoplasm of breast: Secondary | ICD-10-CM | POA: Diagnosis not present

## 2015-04-24 DIAGNOSIS — Z85841 Personal history of malignant neoplasm of brain: Secondary | ICD-10-CM | POA: Diagnosis not present

## 2015-04-24 DIAGNOSIS — R32 Unspecified urinary incontinence: Secondary | ICD-10-CM | POA: Diagnosis not present

## 2015-04-27 DIAGNOSIS — Z91041 Radiographic dye allergy status: Secondary | ICD-10-CM | POA: Diagnosis not present

## 2015-04-27 DIAGNOSIS — F329 Major depressive disorder, single episode, unspecified: Secondary | ICD-10-CM | POA: Diagnosis not present

## 2015-04-27 DIAGNOSIS — Z792 Long term (current) use of antibiotics: Secondary | ICD-10-CM | POA: Diagnosis not present

## 2015-04-27 DIAGNOSIS — M3500 Sicca syndrome, unspecified: Secondary | ICD-10-CM | POA: Diagnosis not present

## 2015-04-27 DIAGNOSIS — D509 Iron deficiency anemia, unspecified: Secondary | ICD-10-CM | POA: Diagnosis not present

## 2015-04-27 DIAGNOSIS — E538 Deficiency of other specified B group vitamins: Secondary | ICD-10-CM | POA: Diagnosis not present

## 2015-04-27 DIAGNOSIS — Z85841 Personal history of malignant neoplasm of brain: Secondary | ICD-10-CM | POA: Diagnosis not present

## 2015-04-27 DIAGNOSIS — D472 Monoclonal gammopathy: Secondary | ICD-10-CM | POA: Diagnosis not present

## 2015-04-27 DIAGNOSIS — Z853 Personal history of malignant neoplasm of breast: Secondary | ICD-10-CM | POA: Diagnosis not present

## 2015-04-27 DIAGNOSIS — C969 Malignant neoplasm of lymphoid, hematopoietic and related tissue, unspecified: Secondary | ICD-10-CM | POA: Diagnosis not present

## 2015-04-27 DIAGNOSIS — C8200 Follicular lymphoma grade I, unspecified site: Secondary | ICD-10-CM | POA: Diagnosis not present

## 2015-04-27 DIAGNOSIS — J189 Pneumonia, unspecified organism: Secondary | ICD-10-CM | POA: Diagnosis not present

## 2015-04-27 DIAGNOSIS — I248 Other forms of acute ischemic heart disease: Secondary | ICD-10-CM | POA: Diagnosis not present

## 2015-04-27 DIAGNOSIS — I10 Essential (primary) hypertension: Secondary | ICD-10-CM | POA: Diagnosis not present

## 2015-04-27 DIAGNOSIS — R918 Other nonspecific abnormal finding of lung field: Secondary | ICD-10-CM | POA: Diagnosis not present

## 2015-04-27 DIAGNOSIS — E7800 Pure hypercholesterolemia, unspecified: Secondary | ICD-10-CM | POA: Diagnosis not present

## 2015-04-27 DIAGNOSIS — Z85828 Personal history of other malignant neoplasm of skin: Secondary | ICD-10-CM | POA: Diagnosis not present

## 2015-04-27 DIAGNOSIS — I2699 Other pulmonary embolism without acute cor pulmonale: Secondary | ICD-10-CM | POA: Diagnosis not present

## 2015-04-27 DIAGNOSIS — Z9049 Acquired absence of other specified parts of digestive tract: Secondary | ICD-10-CM | POA: Diagnosis not present

## 2015-04-27 DIAGNOSIS — K219 Gastro-esophageal reflux disease without esophagitis: Secondary | ICD-10-CM | POA: Diagnosis not present

## 2015-04-27 DIAGNOSIS — E039 Hypothyroidism, unspecified: Secondary | ICD-10-CM | POA: Diagnosis not present

## 2015-04-27 DIAGNOSIS — C8511 Unspecified B-cell lymphoma, lymph nodes of head, face, and neck: Secondary | ICD-10-CM | POA: Diagnosis not present

## 2015-04-27 DIAGNOSIS — D709 Neutropenia, unspecified: Secondary | ICD-10-CM | POA: Diagnosis not present

## 2015-04-27 DIAGNOSIS — C8580 Other specified types of non-Hodgkin lymphoma, unspecified site: Secondary | ICD-10-CM | POA: Diagnosis not present

## 2015-04-27 DIAGNOSIS — Z9011 Acquired absence of right breast and nipple: Secondary | ICD-10-CM | POA: Diagnosis not present

## 2015-04-27 DIAGNOSIS — R000 Tachycardia, unspecified: Secondary | ICD-10-CM | POA: Diagnosis not present

## 2015-04-27 DIAGNOSIS — Z91048 Other nonmedicinal substance allergy status: Secondary | ICD-10-CM | POA: Diagnosis not present

## 2015-04-27 DIAGNOSIS — M810 Age-related osteoporosis without current pathological fracture: Secondary | ICD-10-CM | POA: Diagnosis not present

## 2015-04-27 DIAGNOSIS — M1990 Unspecified osteoarthritis, unspecified site: Secondary | ICD-10-CM | POA: Diagnosis not present

## 2015-04-27 DIAGNOSIS — R32 Unspecified urinary incontinence: Secondary | ICD-10-CM | POA: Diagnosis not present

## 2015-04-27 DIAGNOSIS — R0602 Shortness of breath: Secondary | ICD-10-CM | POA: Diagnosis not present

## 2015-04-27 DIAGNOSIS — R5383 Other fatigue: Secondary | ICD-10-CM | POA: Diagnosis not present

## 2015-04-28 DIAGNOSIS — J189 Pneumonia, unspecified organism: Secondary | ICD-10-CM | POA: Diagnosis not present

## 2015-04-28 DIAGNOSIS — Z853 Personal history of malignant neoplasm of breast: Secondary | ICD-10-CM | POA: Diagnosis not present

## 2015-04-28 DIAGNOSIS — Z85841 Personal history of malignant neoplasm of brain: Secondary | ICD-10-CM | POA: Diagnosis not present

## 2015-04-28 DIAGNOSIS — R32 Unspecified urinary incontinence: Secondary | ICD-10-CM | POA: Diagnosis not present

## 2015-04-28 DIAGNOSIS — C969 Malignant neoplasm of lymphoid, hematopoietic and related tissue, unspecified: Secondary | ICD-10-CM | POA: Diagnosis not present

## 2015-04-28 DIAGNOSIS — M3500 Sicca syndrome, unspecified: Secondary | ICD-10-CM | POA: Diagnosis not present

## 2015-04-28 DIAGNOSIS — I10 Essential (primary) hypertension: Secondary | ICD-10-CM | POA: Diagnosis not present

## 2015-04-29 DIAGNOSIS — C969 Malignant neoplasm of lymphoid, hematopoietic and related tissue, unspecified: Secondary | ICD-10-CM | POA: Diagnosis not present

## 2015-04-29 DIAGNOSIS — Z853 Personal history of malignant neoplasm of breast: Secondary | ICD-10-CM | POA: Diagnosis not present

## 2015-04-29 DIAGNOSIS — M3500 Sicca syndrome, unspecified: Secondary | ICD-10-CM | POA: Diagnosis not present

## 2015-04-29 DIAGNOSIS — R32 Unspecified urinary incontinence: Secondary | ICD-10-CM | POA: Diagnosis not present

## 2015-04-29 DIAGNOSIS — J189 Pneumonia, unspecified organism: Secondary | ICD-10-CM | POA: Diagnosis not present

## 2015-04-29 DIAGNOSIS — Z85841 Personal history of malignant neoplasm of brain: Secondary | ICD-10-CM | POA: Diagnosis not present

## 2015-04-29 DIAGNOSIS — I10 Essential (primary) hypertension: Secondary | ICD-10-CM | POA: Diagnosis not present

## 2015-05-03 DIAGNOSIS — C8580 Other specified types of non-Hodgkin lymphoma, unspecified site: Secondary | ICD-10-CM | POA: Diagnosis not present

## 2015-05-03 DIAGNOSIS — I2699 Other pulmonary embolism without acute cor pulmonale: Secondary | ICD-10-CM | POA: Diagnosis not present

## 2015-05-03 DIAGNOSIS — C8200 Follicular lymphoma grade I, unspecified site: Secondary | ICD-10-CM | POA: Diagnosis not present

## 2015-05-03 DIAGNOSIS — E870 Hyperosmolality and hypernatremia: Secondary | ICD-10-CM | POA: Diagnosis not present

## 2015-05-03 DIAGNOSIS — Z7901 Long term (current) use of anticoagulants: Secondary | ICD-10-CM | POA: Diagnosis not present

## 2015-05-03 DIAGNOSIS — D709 Neutropenia, unspecified: Secondary | ICD-10-CM | POA: Diagnosis not present

## 2015-05-03 DIAGNOSIS — Z888 Allergy status to other drugs, medicaments and biological substances status: Secondary | ICD-10-CM | POA: Diagnosis not present

## 2015-05-03 DIAGNOSIS — R509 Fever, unspecified: Secondary | ICD-10-CM | POA: Diagnosis not present

## 2015-05-03 DIAGNOSIS — C8510 Unspecified B-cell lymphoma, unspecified site: Secondary | ICD-10-CM | POA: Diagnosis not present

## 2015-05-03 DIAGNOSIS — Z0189 Encounter for other specified special examinations: Secondary | ICD-10-CM | POA: Diagnosis not present

## 2015-05-03 DIAGNOSIS — J189 Pneumonia, unspecified organism: Secondary | ICD-10-CM | POA: Diagnosis not present

## 2015-05-03 DIAGNOSIS — J1289 Other viral pneumonia: Secondary | ICD-10-CM | POA: Diagnosis not present

## 2015-05-03 DIAGNOSIS — I959 Hypotension, unspecified: Secondary | ICD-10-CM | POA: Diagnosis not present

## 2015-05-03 DIAGNOSIS — N179 Acute kidney failure, unspecified: Secondary | ICD-10-CM | POA: Diagnosis not present

## 2015-05-03 DIAGNOSIS — R05 Cough: Secondary | ICD-10-CM | POA: Diagnosis not present

## 2015-05-03 DIAGNOSIS — N170 Acute kidney failure with tubular necrosis: Secondary | ICD-10-CM | POA: Diagnosis not present

## 2015-05-03 DIAGNOSIS — B449 Aspergillosis, unspecified: Secondary | ICD-10-CM | POA: Diagnosis not present

## 2015-05-03 DIAGNOSIS — Z792 Long term (current) use of antibiotics: Secondary | ICD-10-CM | POA: Diagnosis not present

## 2015-05-03 DIAGNOSIS — R918 Other nonspecific abnormal finding of lung field: Secondary | ICD-10-CM | POA: Diagnosis not present

## 2015-05-03 DIAGNOSIS — D649 Anemia, unspecified: Secondary | ICD-10-CM | POA: Diagnosis not present

## 2015-05-03 DIAGNOSIS — J9602 Acute respiratory failure with hypercapnia: Secondary | ICD-10-CM | POA: Diagnosis not present

## 2015-05-03 DIAGNOSIS — R5081 Fever presenting with conditions classified elsewhere: Secondary | ICD-10-CM | POA: Diagnosis not present

## 2015-05-03 DIAGNOSIS — J90 Pleural effusion, not elsewhere classified: Secondary | ICD-10-CM | POA: Diagnosis not present

## 2015-05-03 DIAGNOSIS — Z9911 Dependence on respirator [ventilator] status: Secondary | ICD-10-CM | POA: Diagnosis not present

## 2015-05-03 DIAGNOSIS — Z9981 Dependence on supplemental oxygen: Secondary | ICD-10-CM | POA: Diagnosis not present

## 2015-05-03 DIAGNOSIS — R0682 Tachypnea, not elsewhere classified: Secondary | ICD-10-CM | POA: Diagnosis not present

## 2015-05-03 DIAGNOSIS — M810 Age-related osteoporosis without current pathological fracture: Secondary | ICD-10-CM | POA: Diagnosis not present

## 2015-05-03 DIAGNOSIS — A4189 Other specified sepsis: Secondary | ICD-10-CM | POA: Diagnosis not present

## 2015-05-03 DIAGNOSIS — R0602 Shortness of breath: Secondary | ICD-10-CM | POA: Diagnosis not present

## 2015-05-03 DIAGNOSIS — Z95828 Presence of other vascular implants and grafts: Secondary | ICD-10-CM | POA: Diagnosis not present

## 2015-05-03 DIAGNOSIS — Z85828 Personal history of other malignant neoplasm of skin: Secondary | ICD-10-CM | POA: Diagnosis not present

## 2015-05-03 DIAGNOSIS — Z853 Personal history of malignant neoplasm of breast: Secondary | ICD-10-CM | POA: Diagnosis not present

## 2015-05-03 DIAGNOSIS — Z4682 Encounter for fitting and adjustment of non-vascular catheter: Secondary | ICD-10-CM | POA: Diagnosis not present

## 2015-05-03 DIAGNOSIS — M1990 Unspecified osteoarthritis, unspecified site: Secondary | ICD-10-CM | POA: Diagnosis not present

## 2015-05-03 DIAGNOSIS — J1008 Influenza due to other identified influenza virus with other specified pneumonia: Secondary | ICD-10-CM | POA: Diagnosis not present

## 2015-05-03 DIAGNOSIS — M3500 Sicca syndrome, unspecified: Secondary | ICD-10-CM | POA: Diagnosis not present

## 2015-05-03 DIAGNOSIS — J9601 Acute respiratory failure with hypoxia: Secondary | ICD-10-CM | POA: Diagnosis not present

## 2015-05-03 DIAGNOSIS — B348 Other viral infections of unspecified site: Secondary | ICD-10-CM | POA: Diagnosis not present

## 2015-05-03 DIAGNOSIS — I5023 Acute on chronic systolic (congestive) heart failure: Secondary | ICD-10-CM | POA: Diagnosis not present

## 2015-05-03 DIAGNOSIS — J155 Pneumonia due to Escherichia coli: Secondary | ICD-10-CM | POA: Diagnosis not present

## 2015-05-03 DIAGNOSIS — J17 Pneumonia in diseases classified elsewhere: Secondary | ICD-10-CM | POA: Diagnosis not present

## 2015-05-03 DIAGNOSIS — B338 Other specified viral diseases: Secondary | ICD-10-CM | POA: Diagnosis not present

## 2015-05-03 DIAGNOSIS — J159 Unspecified bacterial pneumonia: Secondary | ICD-10-CM | POA: Diagnosis not present

## 2015-05-03 DIAGNOSIS — E039 Hypothyroidism, unspecified: Secondary | ICD-10-CM | POA: Diagnosis not present

## 2015-05-03 DIAGNOSIS — R000 Tachycardia, unspecified: Secondary | ICD-10-CM | POA: Diagnosis not present

## 2015-05-03 DIAGNOSIS — R590 Localized enlarged lymph nodes: Secondary | ICD-10-CM | POA: Diagnosis not present

## 2015-05-03 DIAGNOSIS — Z91041 Radiographic dye allergy status: Secondary | ICD-10-CM | POA: Diagnosis not present

## 2015-05-03 DIAGNOSIS — J129 Viral pneumonia, unspecified: Secondary | ICD-10-CM | POA: Diagnosis not present

## 2015-05-03 DIAGNOSIS — A419 Sepsis, unspecified organism: Secondary | ICD-10-CM | POA: Diagnosis not present

## 2015-05-03 DIAGNOSIS — J9690 Respiratory failure, unspecified, unspecified whether with hypoxia or hypercapnia: Secondary | ICD-10-CM | POA: Diagnosis not present

## 2015-05-03 DIAGNOSIS — R197 Diarrhea, unspecified: Secondary | ICD-10-CM | POA: Diagnosis not present

## 2015-05-03 DIAGNOSIS — B009 Herpesviral infection, unspecified: Secondary | ICD-10-CM | POA: Diagnosis not present

## 2015-05-03 DIAGNOSIS — E785 Hyperlipidemia, unspecified: Secondary | ICD-10-CM | POA: Diagnosis not present

## 2015-05-03 DIAGNOSIS — D61810 Antineoplastic chemotherapy induced pancytopenia: Secondary | ICD-10-CM | POA: Diagnosis not present

## 2015-05-03 DIAGNOSIS — D638 Anemia in other chronic diseases classified elsewhere: Secondary | ICD-10-CM | POA: Diagnosis not present

## 2015-06-04 DEATH — deceased

## 2016-08-02 IMAGING — NM NM PULMONARY VENT & PERF
16 series · 16 of 16 positions shown · non-contrast
Comparison: None; correlation chest radiograph 04/02/2015,
noncontrast CT chest 04/02/2015

CLINICAL DATA: Long history a shortness of breath with symptoms
worsening in past year particularly last month, breast cancer,
lymphoma, Sjogrens

EXAM:
NUCLEAR MEDICINE VENTILATION - PERFUSION LUNG SCAN
TECHNIQUE: Ventilation images were obtained in multiple projections using
inhaled aerosol Pc-TTm DTPA. Perfusion images were obtained in
multiple projections after intravenous injection of Pc-TTm MAA.
RADIOPHARMACEUTICALS:  33 mCi Iechnetium-99m DTPA aerosol inhalation
and 3.8 mCi Iechnetium-99m MAA IV

[Series 1: ant/post vent · 4.14mm/px · 1 of 1 slices shown (1 of 2)]
[im 1/1]
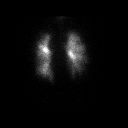

[Series 1: ant/post vent · 4.14mm/px · 1 of 1 slices shown (2 of 2)]
[im 1/1]
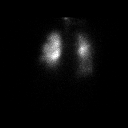

[Series 2: lao/rpo vent · 4.14mm/px · 1 of 1 slices shown (1 of 2)]
[im 1/1]
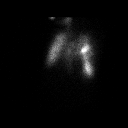

[Series 2: lao/rpo vent · 4.14mm/px · 1 of 1 slices shown (2 of 2)]
[im 1/1]
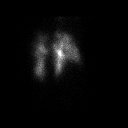

[Series 3: lt lat/rt lat vent · 4.14mm/px · 1 of 1 slices shown (1 of 2)]
[im 1/1]
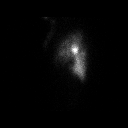

[Series 3: lt lat/rt lat vent · 4.14mm/px · 1 of 1 slices shown (2 of 2)]
[im 1/1]
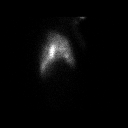

[Series 4: lpo/rao vent · 4.14mm/px · 1 of 1 slices shown (1 of 2)]
[im 1/1]
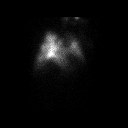

[Series 4: lpo/rao vent · 4.14mm/px · 1 of 1 slices shown (2 of 2)]
[im 1/1]
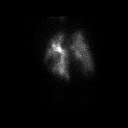

[Series 5: ant/post perf · 4.14mm/px · 1 of 1 slices shown (1 of 2)]
[im 1/1]
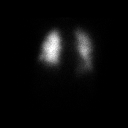

[Series 5: ant/post perf · 4.14mm/px · 1 of 1 slices shown (2 of 2)]
[im 1/1]
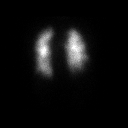

[Series 6: lao/rpo perf · 4.14mm/px · 1 of 1 slices shown (1 of 2)]
[im 1/1]
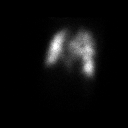

[Series 6: lao/rpo perf · 4.14mm/px · 1 of 1 slices shown (2 of 2)]
[im 1/1]
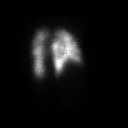

[Series 7: lt lat/rt lat perf · 4.14mm/px · 1 of 1 slices shown (1 of 2)]
[im 1/1]
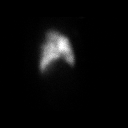

[Series 7: lt lat/rt lat perf · 4.14mm/px · 1 of 1 slices shown (2 of 2)]
[im 1/1]
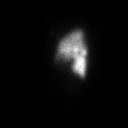

[Series 8: lpo/rao perf · 4.14mm/px · 1 of 1 slices shown (1 of 2)]
[im 1/1]
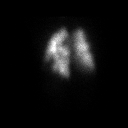

[Series 8: lpo/rao perf · 4.14mm/px · 1 of 1 slices shown (2 of 2)]
[im 1/1]
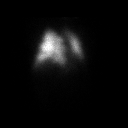

[16 of 16 positions shown; findings below may reference images not displayed]

FINDINGS: Ventilation: Small subsegmental ventilation defects are identified
in the periphery of both lungs. Central airway deposition of
aerosol.

Perfusion: Small peripheral subsegmental perfusion defects matching
to ventilatory findings. No additional perfusion defects identified.

Chest radiograph: Vague nodular density RIGHT upper lobe by chest
radiography with chest CT demonstrating scattered ill-defined multi
lobar patchy irregular airspace opacities.
IMPRESSION: Matching small peripheral subsegmental ventilation and perfusion
defects RIGHT greater than LEFT lungs.

Findings represent a low probability for pulmonary embolism.
